# Patient Record
Sex: Male | Born: 1990 | Race: White | Hispanic: No | Marital: Single | Smoking: Former smoker
Health system: Southern US, Community
[De-identification: ages and names within clinical notes are randomized; demographics above are authoritative.]

## PROBLEM LIST (undated history)

## (undated) DIAGNOSIS — K219 Gastro-esophageal reflux disease without esophagitis: Secondary | ICD-10-CM

## (undated) HISTORY — PX: OTHER SURGICAL HISTORY: SHX169

---
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
## 2010-02-23 ENCOUNTER — Inpatient Hospital Stay (HOSPITAL_COMMUNITY): Admission: EM | Admit: 2010-02-23 | Discharge: 2010-02-25 | Payer: Self-pay | Admitting: Emergency Medicine

## 2010-08-29 LAB — COMPREHENSIVE METABOLIC PANEL WITH GFR
ALT: 23 U/L (ref 0–53)
ALT: 27 U/L (ref 0–53)
AST: 36 U/L (ref 0–37)
Albumin: 2.9 g/dL — ABNORMAL LOW (ref 3.5–5.2)
Alkaline Phosphatase: 131 U/L — ABNORMAL HIGH (ref 39–117)
CO2: 27 meq/L (ref 19–32)
Calcium: 8.1 mg/dL — ABNORMAL LOW (ref 8.4–10.5)
Chloride: 102 meq/L (ref 96–112)
Creatinine, Ser: 0.69 mg/dL (ref 0.4–1.5)
Creatinine, Ser: 0.73 mg/dL (ref 0.4–1.5)
GFR calc Af Amer: >60 mL/min (ref >60)
GFR calc Af Amer: >60 mL/min (ref >60)
GFR calc non Af Amer: >60 mL/min (ref >60)
Glucose, Bld: 141 mg/dL — ABNORMAL HIGH (ref 70–99)
Potassium: 4.2 meq/L (ref 3.5–5.1)
Sodium: 131 meq/L — ABNORMAL LOW (ref 135–145)
Sodium: 136 meq/L (ref 135–145)
Total Bilirubin: 0.7 mg/dL (ref 0.3–1.2)
Total Protein: 7 g/dL (ref 6.0–8.3)

## 2010-08-29 LAB — DIFFERENTIAL
Basophils Absolute: 0 10*3/uL (ref 0.0–0.1)
Basophils Relative: 0 % (ref 0–1)
Eosinophils Absolute: 0 10*3/uL (ref 0.0–0.7)
Eosinophils Relative: 0 % (ref 0–5)
Lymphocytes Relative: 7 % — ABNORMAL LOW (ref 12–46)
Monocytes Absolute: 1.5 10*3/uL — ABNORMAL HIGH (ref 0.1–1.0)

## 2010-08-29 LAB — CULTURE, BLOOD (ROUTINE X 2): Culture: NO GROWTH

## 2010-08-29 LAB — URINE CULTURE
Colony Count: NO GROWTH
Colony Count: NO GROWTH
Culture  Setup Time: 201109102021
Culture  Setup Time: 201109111202
Culture: NO GROWTH
Culture: NO GROWTH

## 2010-08-29 LAB — POCT I-STAT 3, ART BLOOD GAS (G3+)
Acid-Base Excess: 1 mmol/L (ref 0.0–2.0)
Bicarbonate: 25.3 mEq/L — ABNORMAL HIGH (ref 20.0–24.0)
O2 Saturation: 97 %
Patient temperature: 98.6
TCO2: 27 mmol/L (ref 0–100)
pCO2 arterial: 40.5 mmHg (ref 35.0–45.0)
pH, Arterial: 7.404 (ref 7.350–7.450)
pO2, Arterial: 91 mmHg (ref 80.0–100.0)

## 2010-08-29 LAB — D-DIMER, QUANTITATIVE: D-Dimer, Quant: 0.7 ug{FEU}/mL — ABNORMAL HIGH (ref 0.00–0.48)

## 2010-08-29 LAB — URINALYSIS, ROUTINE W REFLEX MICROSCOPIC
Bilirubin Urine: NEGATIVE
Glucose, UA: NEGATIVE mg/dL
Hgb urine dipstick: NEGATIVE
Ketones, ur: NEGATIVE mg/dL
Nitrite: NEGATIVE
Protein, ur: NEGATIVE mg/dL
Specific Gravity, Urine: 1.01 (ref 1.005–1.030)
Urobilinogen, UA: 0.2 mg/dL (ref 0.0–1.0)
pH: 6 (ref 5.0–8.0)

## 2010-08-29 LAB — COMPREHENSIVE METABOLIC PANEL
AST: 22 U/L (ref 0–37)
Albumin: 3 g/dL — ABNORMAL LOW (ref 3.5–5.2)
Alkaline Phosphatase: 138 U/L — ABNORMAL HIGH (ref 39–117)
BUN: 10 mg/dL (ref 6–23)
BUN: 7 mg/dL (ref 6–23)
CO2: 25 mEq/L (ref 19–32)
Calcium: 8.6 mg/dL (ref 8.4–10.5)
Chloride: 99 mEq/L (ref 96–112)
GFR calc non Af Amer: >60 mL/min (ref >60)
Glucose, Bld: 108 mg/dL — ABNORMAL HIGH (ref 70–99)
Potassium: 3.4 mEq/L — ABNORMAL LOW (ref 3.5–5.1)
Total Bilirubin: 0.5 mg/dL (ref 0.3–1.2)
Total Protein: 6.8 g/dL (ref 6.0–8.3)

## 2010-08-29 LAB — CBC
MCH: 29.9 pg (ref 26.0–34.0)
MCV: 88.4 fL (ref 78.0–100.0)
Platelets: 244 10*3/uL (ref 150–400)
RBC: 5.09 MIL/uL (ref 4.22–5.81)
RDW: 13 % (ref 11.5–15.5)
WBC: 14.6 10*3/uL — ABNORMAL HIGH (ref 4.0–10.5)

## 2010-08-29 LAB — BASIC METABOLIC PANEL
BUN: 8 mg/dL (ref 6–23)
CO2: 28 mEq/L (ref 19–32)
Calcium: 8.5 mg/dL (ref 8.4–10.5)
Creatinine, Ser: 0.93 mg/dL (ref 0.4–1.5)
GFR calc Af Amer: >60 mL/min (ref >60)

## 2010-08-29 LAB — CK: Total CK: 72 U/L (ref 7–232)

## 2010-08-29 LAB — MAGNESIUM: Magnesium: 1.9 mg/dL (ref 1.5–2.5)

## 2010-08-29 LAB — PHOSPHORUS: Phosphorus: 3.9 mg/dL (ref 2.3–4.6)

## 2010-08-29 LAB — BRAIN NATRIURETIC PEPTIDE: Pro B Natriuretic peptide (BNP): 40 pg/mL (ref 0.0–100.0)

## 2010-09-04 ENCOUNTER — Emergency Department (HOSPITAL_COMMUNITY)
Admission: EM | Admit: 2010-09-04 | Discharge: 2010-09-05 | Disposition: A | Discharge status: Home / Self Care | Payer: Medicaid Other | Attending: Emergency Medicine | Admitting: Emergency Medicine

## 2010-09-04 ENCOUNTER — Emergency Department (HOSPITAL_COMMUNITY): Payer: Medicaid Other

## 2010-09-04 DIAGNOSIS — F909 Attention-deficit hyperactivity disorder, unspecified type: Secondary | ICD-10-CM | POA: Insufficient documentation

## 2010-09-04 DIAGNOSIS — F319 Bipolar disorder, unspecified: Secondary | ICD-10-CM | POA: Insufficient documentation

## 2010-09-04 DIAGNOSIS — R630 Anorexia: Secondary | ICD-10-CM | POA: Insufficient documentation

## 2010-09-04 DIAGNOSIS — R110 Nausea: Secondary | ICD-10-CM | POA: Insufficient documentation

## 2010-09-04 DIAGNOSIS — R1031 Right lower quadrant pain: Secondary | ICD-10-CM | POA: Insufficient documentation

## 2010-09-04 DIAGNOSIS — Z79899 Other long term (current) drug therapy: Secondary | ICD-10-CM | POA: Insufficient documentation

## 2010-09-04 LAB — DIFFERENTIAL
Basophils Relative: 0 % (ref 0–1)
Lymphocytes Relative: 10 % — ABNORMAL LOW (ref 12–46)
Lymphs Abs: 1.5 10*3/uL (ref 0.7–4.0)
Monocytes Relative: 8 % (ref 3–12)
Neutro Abs: 12.5 10*3/uL — ABNORMAL HIGH (ref 1.7–7.7)
Neutrophils Relative %: 81 % — ABNORMAL HIGH (ref 43–77)

## 2010-09-04 LAB — URINALYSIS, ROUTINE W REFLEX MICROSCOPIC
Bilirubin Urine: NEGATIVE
Glucose, UA: NEGATIVE mg/dL
Ketones, ur: 15 mg/dL — AB
Nitrite: NEGATIVE
Specific Gravity, Urine: 1.019 (ref 1.005–1.030)
pH: 5.5 (ref 5.0–8.0)

## 2010-09-04 LAB — CBC
HCT: 45.6 % (ref 39.0–52.0)
Hemoglobin: 15.8 g/dL (ref 13.0–17.0)
MCH: 30.4 pg (ref 26.0–34.0)
MCV: 87.7 fL (ref 78.0–100.0)
RBC: 5.2 MIL/uL (ref 4.22–5.81)

## 2010-09-04 LAB — COMPREHENSIVE METABOLIC PANEL
AST: 38 U/L — ABNORMAL HIGH (ref 0–37)
CO2: 25 mEq/L (ref 19–32)
Chloride: 101 mEq/L (ref 96–112)
Creatinine, Ser: 0.84 mg/dL (ref 0.4–1.5)
GFR calc Af Amer: >60 mL/min (ref >60)
GFR calc non Af Amer: >60 mL/min (ref >60)
Glucose, Bld: 87 mg/dL (ref 70–99)
Total Bilirubin: 1.1 mg/dL (ref 0.3–1.2)

## 2010-09-05 MED ORDER — IOHEXOL 300 MG/ML  SOLN
80.0000 mL | Freq: Once | INTRAMUSCULAR | Status: AC | PRN
  Administered 2010-09-05: 80 mL via INTRAVENOUS

## 2011-05-03 ENCOUNTER — Emergency Department (HOSPITAL_COMMUNITY)
Admission: EM | Admit: 2011-05-03 | Discharge: 2011-05-04 | Disposition: A | Discharge status: Home / Self Care | Payer: Medicaid Other | Attending: Emergency Medicine | Admitting: Emergency Medicine

## 2011-05-03 ENCOUNTER — Emergency Department (HOSPITAL_COMMUNITY): Payer: Medicaid Other

## 2011-05-03 ENCOUNTER — Encounter: Payer: Self-pay | Admitting: *Deleted

## 2011-05-03 DIAGNOSIS — IMO0001 Reserved for inherently not codable concepts without codable children: Secondary | ICD-10-CM | POA: Insufficient documentation

## 2011-05-03 DIAGNOSIS — R5381 Other malaise: Secondary | ICD-10-CM | POA: Insufficient documentation

## 2011-05-03 DIAGNOSIS — R059 Cough, unspecified: Secondary | ICD-10-CM | POA: Insufficient documentation

## 2011-05-03 DIAGNOSIS — R079 Chest pain, unspecified: Secondary | ICD-10-CM | POA: Insufficient documentation

## 2011-05-03 DIAGNOSIS — Z79899 Other long term (current) drug therapy: Secondary | ICD-10-CM | POA: Insufficient documentation

## 2011-05-03 DIAGNOSIS — R05 Cough: Secondary | ICD-10-CM | POA: Insufficient documentation

## 2011-05-03 DIAGNOSIS — J3489 Other specified disorders of nose and nasal sinuses: Secondary | ICD-10-CM | POA: Insufficient documentation

## 2011-05-03 DIAGNOSIS — R000 Tachycardia, unspecified: Secondary | ICD-10-CM | POA: Insufficient documentation

## 2011-05-03 LAB — POCT I-STAT, CHEM 8
BUN: 10 mg/dL (ref 6–23)
Calcium, Ion: 1.09 mmol/L — ABNORMAL LOW (ref 1.12–1.32)
Chloride: 103 mEq/L (ref 96–112)
Creatinine, Ser: 0.8 mg/dL (ref 0.50–1.35)
Glucose, Bld: 94 mg/dL (ref 70–99)
Potassium: 4 mEq/L (ref 3.5–5.1)

## 2011-05-03 LAB — CBC
HCT: 38.8 % — ABNORMAL LOW (ref 39.0–52.0)
Hemoglobin: 13.1 g/dL (ref 13.0–17.0)
MCH: 30 pg (ref 26.0–34.0)
MCHC: 33.8 g/dL (ref 30.0–36.0)
RBC: 4.37 MIL/uL (ref 4.22–5.81)

## 2011-05-03 LAB — DIFFERENTIAL
Basophils Relative: 0 % (ref 0–1)
Lymphs Abs: 1.6 10*3/uL (ref 0.7–4.0)
Monocytes Absolute: 1.1 10*3/uL — ABNORMAL HIGH (ref 0.1–1.0)
Monocytes Relative: 7 % (ref 3–12)
Neutro Abs: 13.4 10*3/uL — ABNORMAL HIGH (ref 1.7–7.7)
Neutrophils Relative %: 82 % — ABNORMAL HIGH (ref 43–77)

## 2011-05-03 MED ORDER — ACETAMINOPHEN 500 MG PO TABS
1000.0000 mg | ORAL_TABLET | Freq: Once | ORAL | Status: AC
  Administered 2011-05-03: 975 mg via ORAL
  Filled 2011-05-03: qty 2

## 2011-05-03 MED ORDER — SODIUM CHLORIDE 0.9 % IV BOLUS (SEPSIS)
1000.0000 mL | Freq: Once | INTRAVENOUS | Status: AC
  Administered 2011-05-03: 1000 mL via INTRAVENOUS

## 2011-05-03 MED ORDER — ACETAMINOPHEN 325 MG PO TABS
ORAL_TABLET | ORAL | Status: AC
  Filled 2011-05-03: qty 3

## 2011-05-03 NOTE — ED Notes (Signed)
Patient with history of Cystic Fibrosis and has been coughing for about a  Week.  Pain now on his chest when he coughs, or touches his chest.  Patient's respiratory is intact.

## 2011-05-03 NOTE — ED Notes (Signed)
Pt c/o left sided chest pain, productive cough, with green sputum. Soreness in right shoulder blade, soreness in bilateral wrist. Denies cocaine use. Pain is constant and radiating all over per pt.

## 2011-05-03 NOTE — ED Provider Notes (Signed)
History     CSN: [id number] Arrival date & time: 05/03/2011  8:17 PM   First MD Initiated Contact with Patient 05/03/11 2128      Chief Complaint  Patient presents with  . Cough    (Consider location/radiation/quality/duration/timing/severity/associated sxs/prior treatment) Patient is a 20 y.o. male presenting with cough. The history is provided by the patient.  Cough This is a new problem. The current episode started more than 2 days ago. The problem occurs constantly. The problem has been gradually worsening. The cough is productive of purulent sputum. There has been no fever. Associated symptoms include chest pain, rhinorrhea and myalgias. Pertinent negatives include no chills, no sweats, no weight loss, no ear congestion, no ear pain, no headaches, no sore throat, no shortness of breath, no wheezing and no eye redness. He has tried nothing for the symptoms. He is not a smoker.  PMH of cystic fibrosis (followed at [facility] for this). Pt states for about the past week he has been coughing more than his baseline, and has been producing purulent sputum. He denies fever, chills, nausea, vomiting.   Past Medical History  Diagnosis Date  . Cystic fibrosis     History reviewed. No pertinent past surgical history.  No family history on file.  History  Substance Use Topics  . Smoking status: Never Smoker   . Smokeless tobacco: Not on file  . Alcohol Use: No      Review of Systems  Constitutional: Positive for fatigue. Negative for fever, chills, weight loss, diaphoresis, activity change and appetite change.  HENT: Positive for rhinorrhea. Negative for ear pain and sore throat.   Eyes: Negative for redness.  Respiratory: Positive for cough. Negative for shortness of breath and wheezing.   Cardiovascular: Positive for chest pain.  Gastrointestinal: Negative.   Musculoskeletal: Positive for myalgias.  Neurological: Negative for dizziness, syncope, light-headedness and headaches.     Allergies  Amoxapine and related; Amoxicillin; Morphine and related; and Penicillins  Home Medications   Current Outpatient Rx  Name Route Sig Dispense Refill  . AZITHROMYCIN 500 MG PO TABS Oral Take 500 mg by mouth every Monday, Wednesday, and Friday.      . BUDESONIDE 32 MCG/ACT NA SUSP Nasal Place 1 spray into the nose daily.      Marland Kitchen CALCIUM CARBONATE 1250 MG PO TABS Oral Take 1 tablet by mouth daily.      Marland Kitchen CETIRIZINE HCL 10 MG PO TABS Oral Take 10 mg by mouth at bedtime.      Marland Kitchen VITAMIN D 1000 UNITS PO TABS Oral Take 4,000 Units by mouth 2 (two) times daily.      Marland Kitchen DIPHENHYDRAMINE-APAP (SLEEP) 25-500 MG PO TABS Oral Take 2 tablets by mouth at bedtime as needed. sleep     . GUAIFENESIN 400 MG PO TABS Oral Take 400 mg by mouth every 6 (six) hours as needed. Chest congestion     . IBUPROFEN 200 MG PO TABS Oral Take 800 mg by mouth every 6 (six) hours as needed. pain     . THERA M PLUS PO TABS Oral Take 1 tablet by mouth daily.      Marland Kitchen NAPROXEN SODIUM 220 MG PO TABS Oral Take 220-440 mg by mouth 2 (two) times daily as needed. headache     . CREON PO Oral Take 8 capsules by mouth. Lipase 24,000, protease 76,000, amylase 120,000, 8 capsule with meals , 6 capsules with snacks.     Marland Kitchen PANTOPRAZOLE SODIUM 40 MG PO TBEC  Oral Take 40 mg by mouth 2 (two) times daily.      Marland Kitchen URSODIOL 300 MG PO CAPS Oral Take 300 mg by mouth 2 (two) times daily.      Marland Kitchen VITAMIN C 500 MG PO TABS Oral Take 500 mg by mouth daily.        BP 106/68  Pulse 100  Temp(Src) 99.7 F (37.6 C) (Oral)  Resp 20  SpO2 96%  Physical Exam  Nursing note and vitals reviewed. Constitutional: He is oriented to person, place, and time. He appears well-developed and well-nourished. No distress.  HENT:  Head: Normocephalic and atraumatic.  Right Ear: External ear normal.  Left Ear: External ear normal.  Mouth/Throat: Oropharynx is clear and moist. No oropharyngeal exudate.  Eyes: Conjunctivae are normal. Pupils are equal,  round, and reactive to light.  Neck: Normal range of motion. Neck supple.  Cardiovascular: Regular rhythm and normal heart sounds.  Tachycardia present.   Pulmonary/Chest: Effort normal and breath sounds normal. No respiratory distress. He has no wheezes. He has no rales. He exhibits no tenderness.  Abdominal: Soft. Bowel sounds are normal. There is no tenderness.  Musculoskeletal: Normal range of motion.  Lymphadenopathy:    He has no cervical adenopathy.  Neurological: He is alert and oriented to person, place, and time.  Skin: Skin is warm and dry. He is not diaphoretic.  Psychiatric: He has a normal mood and affect.    ED Course  Procedures (including critical care time)  Labs Reviewed  CBC - Abnormal; Notable for the following:    WBC 16.3 (*)    HCT 38.8 (*)    All other components within normal limits  DIFFERENTIAL - Abnormal; Notable for the following:    Neutrophils Relative 82 (*)    Neutro Abs 13.4 (*)    Lymphocytes Relative 10 (*)    Monocytes Absolute 1.1 (*)    All other components within normal limits  POCT I-STAT, CHEM 8 - Abnormal; Notable for the following:    Calcium, Ion 1.09 (*)    All other components within normal limits  I-STAT, CHEM 8   Dg Chest 2 View  05/03/2011  *RADIOLOGY REPORT*  Clinical Data: Cystic fibrosis, cough, fever.  CHEST - 2 VIEW  Comparison: 02/23/2010  Findings: Bronchiectatic changes involve the upper lungs, similar to prior.  No new areas of consolidation.  No pleural effusion or pneumothorax.  Cardiomediastinal contours within normal limits.  No acute osseous abnormality.  IMPRESSION: Similar chronic changes of cystic fibrosis with bronchiectasis predominately involving the upper lobes.  No new areas of consolidation.  Original Report Authenticated By: Waneta Martins, M.D.     1. Cystic fibrosis       MDM  D/W Dr. Effie Shy. Patient with a hx of CF, presenting with increased cough and myalgias, nontoxic appearing. He was  febrile on presentation, which improved with 1g of APAP. Xray appears unchanged from the pt's baseline; no signs of PNA. He was rehydrated in the dept with about of fluids and VS responded well to this; they were stable at d/c. He was d/ced home with rx for Avelox, which he is to take on top of his 3x weekly Zithromax. He was instructed to f/u here, with PCP, or with specialist at [facility] if he is not improving or is worsening. He verbalized understanding and agreed to plan.        Grant Fontana, [place] 05/05/11 1150

## 2011-05-04 MED ORDER — MOXIFLOXACIN HCL 400 MG PO TABS: 400.0000 mg | ORAL_TABLET | Freq: Every day | ORAL | Status: DC

## 2011-05-04 NOTE — ED Notes (Signed)
Call received from [facility] requesting change in Avelox due to being too expensive. RX changed by Cherrie Distance PA to Z-pack. New RX called to [facility] [phone]

## 2011-05-04 NOTE — ED Notes (Signed)
Patient transported to X-ray 

## 2011-05-05 NOTE — ED Provider Notes (Signed)
Medical screening examination/treatment/procedure(s) were performed by non-physician practitioner and as supervising physician I was immediately available for consultation/collaboration.  Wanell Lorenzi L Kailan Laws, MD 05/05/11 1319 

## 2011-05-13 ENCOUNTER — Encounter (HOSPITAL_COMMUNITY): Payer: Self-pay | Admitting: Emergency Medicine

## 2011-05-13 ENCOUNTER — Emergency Department (HOSPITAL_COMMUNITY): Payer: Medicaid Other

## 2011-05-13 ENCOUNTER — Inpatient Hospital Stay (HOSPITAL_COMMUNITY)
Admission: EM | Admit: 2011-05-13 | Discharge: 2011-05-16 | DRG: 202 | Disposition: A | Discharge status: Home / Self Care | Payer: Medicaid Other | Attending: Internal Medicine | Admitting: Internal Medicine

## 2011-05-13 DIAGNOSIS — R110 Nausea: Secondary | ICD-10-CM

## 2011-05-13 DIAGNOSIS — J40 Bronchitis, not specified as acute or chronic: Secondary | ICD-10-CM | POA: Diagnosis present

## 2011-05-13 DIAGNOSIS — D72829 Elevated white blood cell count, unspecified: Secondary | ICD-10-CM | POA: Diagnosis present

## 2011-05-13 DIAGNOSIS — R062 Wheezing: Secondary | ICD-10-CM

## 2011-05-13 DIAGNOSIS — R079 Chest pain, unspecified: Secondary | ICD-10-CM | POA: Diagnosis present

## 2011-05-13 DIAGNOSIS — Z87891 Personal history of nicotine dependence: Secondary | ICD-10-CM

## 2011-05-13 DIAGNOSIS — R0600 Dyspnea, unspecified: Secondary | ICD-10-CM

## 2011-05-13 DIAGNOSIS — J209 Acute bronchitis, unspecified: Principal | ICD-10-CM | POA: Diagnosis present

## 2011-05-13 DIAGNOSIS — F909 Attention-deficit hyperactivity disorder, unspecified type: Secondary | ICD-10-CM | POA: Diagnosis present

## 2011-05-13 DIAGNOSIS — R509 Fever, unspecified: Secondary | ICD-10-CM

## 2011-05-13 DIAGNOSIS — K8689 Other specified diseases of pancreas: Secondary | ICD-10-CM | POA: Diagnosis present

## 2011-05-13 DIAGNOSIS — F319 Bipolar disorder, unspecified: Secondary | ICD-10-CM | POA: Diagnosis present

## 2011-05-13 HISTORY — DX: Gastro-esophageal reflux disease without esophagitis: K21.9

## 2011-05-13 LAB — BASIC METABOLIC PANEL
BUN: 8 mg/dL (ref 6–23)
Calcium: 9.4 mg/dL (ref 8.4–10.5)
Creatinine, Ser: 0.89 mg/dL (ref 0.50–1.35)
GFR calc Af Amer: >90 mL/min (ref >90)
GFR calc non Af Amer: >90 mL/min (ref >90)
Glucose, Bld: 101 mg/dL — ABNORMAL HIGH (ref 70–99)
Potassium: 3.7 mEq/L (ref 3.5–5.1)

## 2011-05-13 LAB — CBC
HCT: 45.3 % (ref 39.0–52.0)
Hemoglobin: 15.3 g/dL (ref 13.0–17.0)
MCH: 30 pg (ref 26.0–34.0)
MCHC: 33.8 g/dL (ref 30.0–36.0)
MCV: 88.8 fL (ref 78.0–100.0)
RDW: 12.8 % (ref 11.5–15.5)

## 2011-05-13 MED ORDER — SODIUM CHLORIDE 0.9 % IV SOLN
INTRAVENOUS | Status: DC
  Administered 2011-05-13: 08:00:00 via INTRAVENOUS

## 2011-05-13 MED ORDER — PNEUMOCOCCAL VAC POLYVALENT 25 MCG/0.5ML IJ INJ
0.5000 mL | INJECTION | INTRAMUSCULAR | Status: AC
  Administered 2011-05-14: 0.5 mL via INTRAMUSCULAR
  Filled 2011-05-13: qty 0.5

## 2011-05-13 MED ORDER — INFLUENZA VIRUS VACC SPLIT PF IM SUSP
0.5000 mL | INTRAMUSCULAR | Status: AC
  Administered 2011-05-14: 0.5 mL via INTRAMUSCULAR
  Filled 2011-05-13: qty 0.5

## 2011-05-13 MED ORDER — ALBUTEROL SULFATE (5 MG/ML) 0.5% IN NEBU
5.0000 mg | INHALATION_SOLUTION | RESPIRATORY_TRACT | Status: DC | PRN
  Administered 2011-05-13: 5 mg via RESPIRATORY_TRACT

## 2011-05-13 MED ORDER — ENOXAPARIN SODIUM 40 MG/0.4ML ~~LOC~~ SOLN
40.0000 mg | SUBCUTANEOUS | Status: DC
  Administered 2011-05-14: 40 mg via SUBCUTANEOUS
  Filled 2011-05-13 (×5): qty 0.4

## 2011-05-13 MED ORDER — ALBUTEROL SULFATE (5 MG/ML) 0.5% IN NEBU
5.0000 mg | INHALATION_SOLUTION | Freq: Once | RESPIRATORY_TRACT | Status: DC
  Filled 2011-05-13: qty 1

## 2011-05-13 MED ORDER — ONDANSETRON HCL 4 MG PO TABS: 4.0000 mg | ORAL_TABLET | Freq: Four times a day (QID) | ORAL | Status: DC | PRN

## 2011-05-13 MED ORDER — MOXIFLOXACIN HCL IN NACL 400 MG/250ML IV SOLN
400.0000 mg | Freq: Once | INTRAVENOUS | Status: AC
  Administered 2011-05-13: 400 mg via INTRAVENOUS
  Filled 2011-05-13: qty 250

## 2011-05-13 MED ORDER — ALBUTEROL SULFATE (5 MG/ML) 0.5% IN NEBU
5.0000 mg | INHALATION_SOLUTION | Freq: Once | RESPIRATORY_TRACT | Status: AC
  Administered 2011-05-13: 5 mg via RESPIRATORY_TRACT
  Filled 2011-05-13: qty 1

## 2011-05-13 MED ORDER — ALBUTEROL SULFATE (5 MG/ML) 0.5% IN NEBU
5.0000 mg | INHALATION_SOLUTION | Freq: Four times a day (QID) | RESPIRATORY_TRACT | Status: AC
  Administered 2011-05-13 – 2011-05-14 (×2): 5 mg via RESPIRATORY_TRACT
  Filled 2011-05-13: qty 1
  Filled 2011-05-13: qty 0.5

## 2011-05-13 MED ORDER — IPRATROPIUM BROMIDE 0.02 % IN SOLN
0.5000 mg | Freq: Four times a day (QID) | RESPIRATORY_TRACT | Status: DC
  Administered 2011-05-13 – 2011-05-16 (×8): 0.5 mg via RESPIRATORY_TRACT
  Filled 2011-05-13 (×9): qty 2.5

## 2011-05-13 MED ORDER — SODIUM CHLORIDE 0.9 % IJ SOLN: 3.0000 mL | INTRAMUSCULAR | Status: DC | PRN

## 2011-05-13 MED ORDER — HYDROCODONE-ACETAMINOPHEN 5-325 MG PO TABS
2.0000 | ORAL_TABLET | Freq: Once | ORAL | Status: AC
  Administered 2011-05-13: 2 via ORAL
  Filled 2011-05-13: qty 2

## 2011-05-13 MED ORDER — IPRATROPIUM BROMIDE 0.02 % IN SOLN
0.5000 mg | Freq: Once | RESPIRATORY_TRACT | Status: DC
  Filled 2011-05-13: qty 2.5

## 2011-05-13 MED ORDER — ALUM & MAG HYDROXIDE-SIMETH 200-200-20 MG/5ML PO SUSP: 30.0000 mL | Freq: Four times a day (QID) | ORAL | Status: DC | PRN

## 2011-05-13 MED ORDER — IPRATROPIUM BROMIDE 0.02 % IN SOLN
0.5000 mg | Freq: Once | RESPIRATORY_TRACT | Status: AC
  Administered 2011-05-13: 0.5 mg via RESPIRATORY_TRACT

## 2011-05-13 MED ORDER — ACETAMINOPHEN 325 MG PO TABS: 650.0000 mg | ORAL_TABLET | ORAL | Status: DC | PRN

## 2011-05-13 MED ORDER — SODIUM CHLORIDE 0.9 % IJ SOLN
3.0000 mL | Freq: Two times a day (BID) | INTRAMUSCULAR | Status: DC
  Administered 2011-05-13: 3 mL via INTRAVENOUS
  Administered 2011-05-14: 23:00:00 via INTRAVENOUS
  Administered 2011-05-14 – 2011-05-16 (×4): 3 mL via INTRAVENOUS

## 2011-05-13 MED ORDER — ONDANSETRON HCL 4 MG/2ML IJ SOLN: 4.0000 mg | Freq: Four times a day (QID) | INTRAMUSCULAR | Status: DC | PRN

## 2011-05-13 MED ORDER — OXYCODONE HCL 5 MG PO TABS
5.0000 mg | ORAL_TABLET | ORAL | Status: DC | PRN
  Administered 2011-05-13 – 2011-05-16 (×3): 5 mg via ORAL
  Filled 2011-05-13 (×3): qty 1

## 2011-05-13 MED ORDER — SODIUM CHLORIDE 0.9 % IV BOLUS (SEPSIS)
500.0000 mL | Freq: Once | INTRAVENOUS | Status: AC
  Administered 2011-05-13: 500 mL via INTRAVENOUS

## 2011-05-13 MED ORDER — SENNA 8.6 MG PO TABS: 2.0000 | ORAL_TABLET | Freq: Every day | ORAL | Status: DC | PRN

## 2011-05-13 MED ORDER — ONDANSETRON HCL 4 MG/2ML IJ SOLN
4.0000 mg | Freq: Once | INTRAMUSCULAR | Status: AC
  Administered 2011-05-13: 4 mg via INTRAVENOUS
  Filled 2011-05-13: qty 2

## 2011-05-13 NOTE — H&P (Signed)
PCP:   Leo Grosser, MD, MD   Pulmonologist- Candise Bowens Mcguire [phone] [facility])  Chief Complaint:  Shortness of breath and chest pain.   HPI: Male with cystic fibrosis who comes in with complaint of increasing shortness of breath and pain in the center of his chest. The patient was at [facility] and week ago with similar complaints. He was discharged on Avelox but later switched to ciprofloxacin by his pulmonologist. He states that he has been taking the ciprofloxacin for one week now but his symptoms have worsened. He's received nebulizer treatments and Vicodin in the ER and his dyspnea and pain have both improved. He is able to talk to me in complete sentences. He states his pain is now 5/10 and more bearable. He states that he is coughing up greenish colored sputum and has felt feverish. He is being admitted for pulmonary exacerbation of cystic fibrosis.   I have spoken with his pulmonologist who states that his last sputum culture about 3 months ago grew out Pseudomonas. This was during his last admission in [place]. She states that he did well with tobramycin and ceftaz and recommends that we give him both of these for a total of 2 weeks.   Review of Systems:  Positive for fatigue and loss of about 10-15 pounds in weight over the past 3 months. He has a poor appetite now. He has nausea and had one episode of vomiting today. He has felt feverish as well. HEENT: No sore throat sinus trouble or earache. No blurred vision or double vision. Respiratory: As mentioned in history of present illness. Cardiac: Pain in the center of his chest which is associated more with taking a deep breath and coughing. No palpitations and no pedal edema. GI: positive for vomiting today; no diarrhea or abdominal pain. GU: No dysuria or hematuria Skin: No rash or easy bruising Neurologic: No seizures Psychologic: No depression or anxiety  Past Medical History: Past Medical History    Diagnosis Date  . Cystic fibrosis   . Acid reflux    Past Surgical History  Procedure Date  . Right ear surgery     removal of excess skin  . Picc line placements     Medications: Prior to Admission medications   Medication Sig Start Date End Date Taking? Authorizing Provider  aspirin 325 MG tablet Take 325 mg by mouth every 6 (six) hours as needed. pain    Yes Historical Provider, MD  azithromycin (ZITHROMAX) 500 MG tablet Take 500 mg by mouth every Monday, Wednesday, and Friday.     Yes Historical Provider, MD  Aztreonam Lysine (CAYSTON) 75 MG SOLR Inhale 75 mg into the lungs 3 (three) times daily. Pt does every other month    Yes Historical Provider, MD  budesonide (RHINOCORT AQUA) 32 MCG/ACT nasal spray Place 1 spray into the nose daily.     Yes Historical Provider, MD  cetirizine (ZYRTEC) 10 MG tablet Take 10 mg by mouth at bedtime.     Yes Historical Provider, MD  cholecalciferol (VITAMIN D) 1000 UNITS tablet Take 4,000 Units by mouth 2 (two) times daily.     Yes Historical Provider, MD  guaifenesin (HUMIBID E) 400 MG TABS Take 400 mg by mouth every 6 (six) hours as needed. Chest congestion    Yes Historical Provider, MD  Multiple Vitamins-Minerals (MULTIVITAMINS THER. W/MINERALS) TABS Take 1 tablet by mouth daily.     Yes Historical Provider, MD  naproxen sodium (ANAPROX) 220 MG tablet Take  220-440 mg by mouth 2 (two) times daily as needed. headache    Yes Historical Provider, MD  Pancrelipase, Lip-Prot-Amyl, (CREON PO) Take 8 capsules by mouth. Lipase 24,000, protease 76,000, amylase 120,000, 8 capsule with meals , 6 capsules with snacks.    Yes Historical Provider, MD  pantoprazole (PROTONIX) 40 MG tablet Take 40 mg by mouth 2 (two) times daily.     Yes Historical Provider, MD  ursodiol (ACTIGALL) 300 MG capsule Take 300 mg by mouth 2 (two) times daily.     Yes Historical Provider, MD  vitamin C (ASCORBIC ACID) 500 MG tablet Take 500 mg by mouth daily.     Yes Historical  Provider, MD  albuterol (PROVENTIL HFA;VENTOLIN HFA) 108 (90 BASE) MCG/ACT inhaler Inhale 2 puffs into the lungs every 6 (six) hours as needed. Wheezing      Historical Provider, MD  diphenhydramine-acetaminophen (TYLENOL PM EXTRA STRENGTH) 25-500 MG TABS Take 2 tablets by mouth at bedtime as needed. sleep     Historical Provider, MD  ibuprofen (ADVIL,MOTRIN) 200 MG tablet Take 800 mg by mouth every 6 (six) hours as needed. pain     Historical Provider, MD    Allergies:   Allergies  Allergen Reactions  . Keflex- hives.        . Morphine And Related Other (See Comments)    Skin burns   . Penicillins Hives and Other (See Comments)        Social History:  reports that he quit smoking about 4 weeks ago. His smoking use included Cigarettes. He has never used smokeless tobacco. He reports that he does not drink alcohol or use illicit drugs. he is currently living with his grandparents.  Family History: History reviewed. No pertinent family history. issue heart disease and diabetes in the family. His grandfather had esophago-cancer.  Physical Exam: Filed Vitals:   05/13/11 0756 05/13/11 0949 05/13/11 1138 05/13/11 1513  BP:  92/50 108/60 88/40  Pulse:  73 72 52  Temp:  98.2 F (36.8 C)  97.7 F (36.5 C)  TempSrc:  Oral  Oral  Resp:  24  12  Height:      Weight:      SpO2: 100% 98% 97% 95%   General appearance: alert, cooperative and no distress Throat: lips, mucosa, and tongue normal; teeth and gums normal Resp: clear to auscultation bilaterally Cardio: regular rate and rhythm, S1, S2 normal, no murmur, click, rub or gallop GI: soft, non-tender; bowel sounds normal; no masses,  no organomegaly Extremities: extremities normal, atraumatic, no cyanosis or edema Skin: Skin color, texture, turgor normal. No rashes or lesions Neurologic: Grossly normal   Labs on Admission:   [facility] 05/13/11 0810  NA 135  K 3.7  CL 99  CO2 25  GLUCOSE 101*  BUN 8  CREATININE 0.89   CALCIUM 9.4  MG --  PHOS --   No results found for this basename: AST:2,ALT:2,ALKPHOS:2,BILITOT:2,PROT:2,ALBUMIN:2 in the last 72 hours No results found for this basename: LIPASE:2,AMYLASE:2 in the last 72 hours  Basename 05/13/11 0810  WBC 16.0*  NEUTROABS --  HGB 15.3  HCT 45.3  MCV 88.8  PLT 306   No results found for this basename: CKTOTAL:3,CKMB:3,CKMBINDEX:3,TROPONINI:3 in the last 72 hours No results found for this basename: TSH,T4TOTAL,FREET3,T3FREE,THYROIDAB in the last 72 hours No results found for this basename: VITAMINB12:2,FOLATE:2,FERRITIN:2,TIBC:2,IRON:2,RETICCTPCT:2 in the last 72 hours  Radiological Exams on Admission: Dg Chest 2 View  05/13/2011  *RADIOLOGY REPORT*  Clinical Data: History of cystic fibrosis,  shortness of breath and nausea.  CHEST - 2 VIEW  Comparison: 05/03/2011.  Findings: Reticular interstitial pattern within the lungs and bronchiectatic changes appear stable.  No definite acute superimposed process is identified.  No consolidation or pleural effusion is evident. The cardiac silhouette is normal size and shape.  Mediastinal and hilar contours appear stable. Bones appear average for age.  IMPRESSION: Reticular interstitial pattern within the lungs and bronchiectatic changes appear stable.  No definite acute superimposed process is identified.  Original Report Authenticated By: Crawford Givens, M.D.   Dg Chest 2 View  05/03/2011  *RADIOLOGY REPORT*  Clinical Data: Cystic fibrosis, cough, fever.  CHEST - 2 VIEW  Comparison: 02/23/2010  Findings: Bronchiectatic changes involve the upper lungs, similar to prior.  No new areas of consolidation.  No pleural effusion or pneumothorax.  Cardiomediastinal contours within normal limits.  No acute osseous abnormality.  IMPRESSION: Similar chronic changes of cystic fibrosis with bronchiectasis predominately involving the upper lobes.  No new areas of consolidation.  Original Report Authenticated By: Waneta Martins,  M.D.     Assessment/Plan Principal Problem:  *Cystic fibrosis- pulmonary exacerbation/ leuokocytosis Tobramycin and Ceftazidime. 2 wks recommended by his pulmonologist.  Hypertonic saline nebs. Atrovent and Albuterol nebs.  Sputum culture.  Check for influenza.   Chest pain- Pleuritic. Vicodin PRN.  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    [name] [name] 05/13/2011, 6:15 PM

## 2011-05-13 NOTE — ED Notes (Signed)
Pt states has been in pain x 2 days, upon waking last pm he experienced more pain and sob as well as n/v. Pt talking in complete sentences states cannot take a deep breath, states painful when coughing.

## 2011-05-13 NOTE — ED Notes (Signed)
Bed:WA01<BR> Expected date:<BR> Expected time:<BR> Means of arrival:<BR> Comments:<BR> closed

## 2011-05-13 NOTE — ED Notes (Signed)
Pts grandmother called and stated pts PCP at [facility] would like to speak with admission MD in regards to pts cystic fibrosis and medications. Dr. Athena Masse [phone].

## 2011-05-13 NOTE — ED Notes (Signed)
RT called for breathing Tx.

## 2011-05-13 NOTE — ED Notes (Signed)
Patient transported to X-ray 

## 2011-05-13 NOTE — ED Provider Notes (Signed)
History     CSN: [id number] Arrival date & time: 05/13/2011  7:15 AM   First MD Initiated Contact with Patient 05/13/11 [phone]      Chief Complaint  Patient presents with  . Shortness of Breath    (Consider location/radiation/quality/duration/timing/severity/associated sxs/prior treatment) Patient is a 20 y.o. male presenting with shortness of breath. The history is provided by the patient.  Shortness of Breath  Associated symptoms include cough and shortness of breath. Pertinent negatives include no fever.  pt with hx cystic fibrosis, states in past couple weeks had noted increased cough, generally non productive. Subjective fever. No chills/sweats. States at baseline takes zithromax 3x/week, was placed on cipro 1 week ago. w coughing spells notes mid chest, sharp pain, worse w coughing/movement/palpation chest wall. No leg pain or swelling. No dvt or pe hx. No exertional cp or discomfort. No diaphoresis. Nausea this morning.   Past Medical History  Diagnosis Date  . Cystic fibrosis     History reviewed. No pertinent past surgical history.  History reviewed. No pertinent family history.  History  Substance Use Topics  . Smoking status: Never Smoker   . Smokeless tobacco: Not on file  . Alcohol Use: No      Review of Systems  Constitutional: Negative for fever.  HENT: Negative for neck pain.   Eyes: Negative for redness.  Respiratory: Positive for cough and shortness of breath.   Cardiovascular: Negative for leg swelling.  Gastrointestinal: Negative for abdominal pain.  Genitourinary: Negative for flank pain.  Musculoskeletal: Negative for back pain.  Skin: Negative for rash.  Neurological: Negative for headaches.  Hematological: Does not bruise/bleed easily.  Psychiatric/Behavioral: Negative for confusion.    Allergies  Amoxapine and related; Amoxicillin; Morphine and related; and Penicillins  Home Medications   Current Outpatient Rx  Name Route Sig Dispense  Refill  . AZITHROMYCIN 500 MG PO TABS Oral Take 500 mg by mouth every Monday, Wednesday, and Friday.      . BUDESONIDE 32 MCG/ACT NA SUSP Nasal Place 1 spray into the nose daily.      Marland Kitchen CALCIUM CARBONATE 1250 MG PO TABS Oral Take 1 tablet by mouth daily.      Marland Kitchen CETIRIZINE HCL 10 MG PO TABS Oral Take 10 mg by mouth at bedtime.      Marland Kitchen VITAMIN D 1000 UNITS PO TABS Oral Take 4,000 Units by mouth 2 (two) times daily.      Marland Kitchen DIPHENHYDRAMINE-APAP (SLEEP) 25-500 MG PO TABS Oral Take 2 tablets by mouth at bedtime as needed. sleep     . GUAIFENESIN 400 MG PO TABS Oral Take 400 mg by mouth every 6 (six) hours as needed. Chest congestion     . IBUPROFEN 200 MG PO TABS Oral Take 800 mg by mouth every 6 (six) hours as needed. pain     . MOXIFLOXACIN HCL 400 MG PO TABS Oral Take 1 tablet (400 mg total) by mouth daily. 10 tablet 0  . THERA M PLUS PO TABS Oral Take 1 tablet by mouth daily.      Marland Kitchen NAPROXEN SODIUM 220 MG PO TABS Oral Take 220-440 mg by mouth 2 (two) times daily as needed. headache     . CREON PO Oral Take 8 capsules by mouth. Lipase 24,000, protease 76,000, amylase 120,000, 8 capsule with meals , 6 capsules with snacks.     Marland Kitchen PANTOPRAZOLE SODIUM 40 MG PO TBEC Oral Take 40 mg by mouth 2 (two) times daily.      Marland Kitchen  URSODIOL 300 MG PO CAPS Oral Take 300 mg by mouth 2 (two) times daily.      Marland Kitchen VITAMIN C 500 MG PO TABS Oral Take 500 mg by mouth daily.        BP 109/92  Pulse 95  Temp(Src) 100.2 F (37.9 C) (Oral)  Resp 16  Ht 5\' 9"  (1.753 m)  Wt 122 lb (55.339 kg)  BMI 18.02 kg/m2  SpO2 100%  Physical Exam  Nursing note and vitals reviewed. Constitutional: He is oriented to person, place, and time. He appears well-developed and well-nourished. No distress.  HENT:  Head: Atraumatic.  Mouth/Throat: Oropharynx is clear and moist.  Eyes: Pupils are equal, round, and reactive to light.  Neck: Neck supple. No tracheal deviation present.  Cardiovascular: Normal rate, regular rhythm and normal  heart sounds.  Exam reveals no gallop and no friction rub.   No murmur heard. Pulmonary/Chest: Effort normal. No accessory muscle usage. No respiratory distress. He exhibits tenderness.       Upper resp congestion, rhonchi  Abdominal: Soft. He exhibits no distension. There is no tenderness.  Musculoskeletal: Normal range of motion. He exhibits no edema and no tenderness.  Neurological: He is alert and oriented to person, place, and time.  Skin: Skin is warm and dry.  Psychiatric: He has a normal mood and affect.    ED Course  Procedures (including critical care time)   Labs Reviewed  CBC  BASIC METABOLIC PANEL   Results for orders placed during the hospital encounter of 05/13/11  CBC      Component Value Range   WBC 16.0 (*) 4.0 - 10.5 (K/uL)   RBC 5.10  4.22 - 5.81 (MIL/uL)   Hemoglobin 15.3  13.0 - 17.0 (g/dL)   HCT 60.4  54.0 - 98.1 (%)   MCV 88.8  78.0 - 100.0 (fL)   MCH 30.0  26.0 - 34.0 (pg)   MCHC 33.8  30.0 - 36.0 (g/dL)   RDW 19.1  47.8 - 29.5 (%)   Platelets 306  150 - 400 (K/uL)  BASIC METABOLIC PANEL      Component Value Range   Sodium 135  135 - 145 (mEq/L)   Potassium 3.7  3.5 - 5.1 (mEq/L)   Chloride 99  96 - 112 (mEq/L)   CO2 25  19 - 32 (mEq/L)   Glucose, Bld 101 (*) 70 - 99 (mg/dL)   BUN 8  6 - 23 (mg/dL)   Creatinine, Ser 6.21  0.50 - 1.35 (mg/dL)   Calcium 9.4  8.4 - 30.8 (mg/dL)   GFR calc non Af Amer >90  >90 (mL/min)   GFR calc Af Amer >90  >90 (mL/min)   Dg Chest 2 View  05/13/2011  *RADIOLOGY REPORT*  Clinical Data: History of cystic fibrosis, shortness of breath and nausea.  CHEST - 2 VIEW  Comparison: 05/03/2011.  Findings: Reticular interstitial pattern within the lungs and bronchiectatic changes appear stable.  No definite acute superimposed process is identified.  No consolidation or pleural effusion is evident. The cardiac silhouette is normal size and shape.  Mediastinal and hilar contours appear stable. Bones appear average for age.   IMPRESSION: Reticular interstitial pattern within the lungs and bronchiectatic changes appear stable.  No definite acute superimposed process is identified.  Original Report Authenticated By: Crawford Givens, M.D.   Dg Chest 2 View  05/03/2011  *RADIOLOGY REPORT*  Clinical Data: Cystic fibrosis, cough, fever.  CHEST - 2 VIEW  Comparison: 02/23/2010  Findings: Bronchiectatic  changes involve the upper lungs, similar to prior.  No new areas of consolidation.  No pleural effusion or pneumothorax.  Cardiomediastinal contours within normal limits.  No acute osseous abnormality.  IMPRESSION: Similar chronic changes of cystic fibrosis with bronchiectasis predominately involving the upper lobes.  No new areas of consolidation.  Original Report Authenticated By: Waneta Martins, M.D.       MDM  Xrays. Labs. Albuterol and atrovent neb.    Allergies noted, pt states can take vicodin or percocet for pain.   vicodin po. Recheck no increased wob.    avelox iv.    Given wheezing/sob, subj worsening/fevers after 1 week rx cipro, triad called to admit. Says to admit to team 7.  Suzi Roots, MD 05/13/11 1054

## 2011-05-14 DIAGNOSIS — R079 Chest pain, unspecified: Secondary | ICD-10-CM

## 2011-05-14 DIAGNOSIS — J189 Pneumonia, unspecified organism: Secondary | ICD-10-CM

## 2011-05-14 LAB — CBC
HCT: 35.9 % — ABNORMAL LOW (ref 39.0–52.0)
Platelets: 231 10*3/uL (ref 150–400)
RDW: 12.9 % (ref 11.5–15.5)
WBC: 9.8 10*3/uL (ref 4.0–10.5)

## 2011-05-14 LAB — INFLUENZA PANEL BY PCR (TYPE A & B)
Influenza A By PCR: NEGATIVE
Influenza B By PCR: NEGATIVE

## 2011-05-14 MED ORDER — AZTREONAM LYSINE 75 MG IN SOLR
75.0000 mg | Freq: Three times a day (TID) | RESPIRATORY_TRACT | Status: DC
  Filled 2011-05-14 (×6): qty 75

## 2011-05-14 MED ORDER — LORATADINE 10 MG PO TABS
10.0000 mg | ORAL_TABLET | Freq: Every day | ORAL | Status: DC
  Administered 2011-05-14 – 2011-05-16 (×3): 10 mg via ORAL
  Filled 2011-05-14 (×3): qty 1

## 2011-05-14 MED ORDER — SODIUM CHLORIDE 3 % IN NEBU
15.0000 mL | INHALATION_SOLUTION | Freq: Four times a day (QID) | RESPIRATORY_TRACT | Status: DC
  Administered 2011-05-14 – 2011-05-15 (×4): 15 mL via RESPIRATORY_TRACT
  Filled 2011-05-14 (×12): qty 15

## 2011-05-14 MED ORDER — AZITHROMYCIN 500 MG PO TABS: 500.0000 mg | ORAL_TABLET | ORAL | Status: DC

## 2011-05-14 MED ORDER — TOBRAMYCIN SULFATE 80 MG/2ML IJ SOLN
280.0000 mg | Freq: Two times a day (BID) | INTRAVENOUS | Status: DC
  Administered 2011-05-14 – 2011-05-16 (×4): 280 mg via INTRAVENOUS
  Filled 2011-05-14 (×9): qty 7

## 2011-05-14 MED ORDER — URSODIOL 300 MG PO CAPS
300.0000 mg | ORAL_CAPSULE | Freq: Two times a day (BID) | ORAL | Status: DC
  Administered 2011-05-14 – 2011-05-16 (×5): 300 mg via ORAL
  Filled 2011-05-14 (×7): qty 1

## 2011-05-14 MED ORDER — TOBRAMYCIN SULFATE 80 MG/2ML IJ SOLN
140.0000 mg | Freq: Four times a day (QID) | INTRAVENOUS | Status: DC
  Administered 2011-05-14: 140 mg via INTRAVENOUS
  Filled 2011-05-14 (×4): qty 3.5

## 2011-05-14 MED ORDER — PANCRELIPASE (LIP-PROT-AMYL) 12000-38000 UNITS PO CPEP
2.0000 | ORAL_CAPSULE | Freq: Three times a day (TID) | ORAL | Status: DC
  Administered 2011-05-14 – 2011-05-16 (×7): 2 via ORAL
  Filled 2011-05-14 (×8): qty 2

## 2011-05-14 MED ORDER — HYDROCODONE-ACETAMINOPHEN 5-325 MG PO TABS: 2.0000 | ORAL_TABLET | ORAL | Status: DC | PRN

## 2011-05-14 MED ORDER — PANTOPRAZOLE SODIUM 40 MG PO TBEC
40.0000 mg | DELAYED_RELEASE_TABLET | Freq: Two times a day (BID) | ORAL | Status: DC
  Administered 2011-05-14 – 2011-05-16 (×4): 40 mg via ORAL
  Filled 2011-05-14 (×4): qty 1

## 2011-05-14 MED ORDER — GUAIFENESIN 400 MG PO TABS
400.0000 mg | ORAL_TABLET | Freq: Four times a day (QID) | ORAL | Status: DC
  Administered 2011-05-14 – 2011-05-16 (×9): 400 mg via ORAL
  Filled 2011-05-14 (×12): qty 1

## 2011-05-14 MED ORDER — VITAMIN C 500 MG PO TABS
500.0000 mg | ORAL_TABLET | Freq: Every day | ORAL | Status: DC
  Administered 2011-05-14 – 2011-05-16 (×3): 500 mg via ORAL
  Filled 2011-05-14 (×3): qty 1

## 2011-05-14 MED ORDER — CEFTAZIDIME 2 G IJ SOLR
2.0000 g | Freq: Three times a day (TID) | INTRAMUSCULAR | Status: DC
Start: 2011-05-14 — End: 2011-05-16
  Administered 2011-05-14 – 2011-05-16 (×7): 2 g via INTRAVENOUS
  Filled 2011-05-14 (×9): qty 2

## 2011-05-14 MED ORDER — THERA M PLUS PO TABS
1.0000 | ORAL_TABLET | Freq: Every day | ORAL | Status: DC
Start: 2011-05-14 — End: 2011-05-16
  Administered 2011-05-14: 13:00:00 via ORAL
  Administered 2011-05-15 – 2011-05-16 (×2): 1 via ORAL
  Filled 2011-05-14 (×3): qty 1

## 2011-05-14 MED ORDER — FLUTICASONE PROPIONATE 50 MCG/ACT NA SUSP
2.0000 | Freq: Every day | NASAL | Status: DC
  Administered 2011-05-14 – 2011-05-16 (×3): 2 via NASAL
  Filled 2011-05-14: qty 16

## 2011-05-14 MED ORDER — CEFTAZIDIME 2 G IJ SOLR: 2.0000 g | Freq: Three times a day (TID) | INTRAMUSCULAR | Status: DC

## 2011-05-14 MED ORDER — VITAMIN D3 25 MCG (1000 UNIT) PO TABS
4000.0000 [IU] | ORAL_TABLET | Freq: Two times a day (BID) | ORAL | Status: DC
  Administered 2011-05-14 – 2011-05-16 (×5): 4000 [IU] via ORAL
  Filled 2011-05-14 (×6): qty 4

## 2011-05-14 MED ORDER — ACETAMINOPHEN 325 MG PO TABS: 650.0000 mg | ORAL_TABLET | Freq: Four times a day (QID) | ORAL | Status: DC | PRN

## 2011-05-14 NOTE — Progress Notes (Signed)
CARE MANAGEMENT NOTE 05/14/2011  Patient:  [name] [name]   Account Number:  [id number]  Date Initiated:  05/14/2011  Documentation initiated by:  [name] [name]  Subjective/Objective Assessment:   pt with hx of cystic fibrosis with excerbation of increased sputum and fevers     Action/Plan:   lives at home   Anticipated DC Date:  05/17/2011   Anticipated DC Plan:  HOME/SELF CARE  In-house referral  NA      DC Planning Services  NA      [facility] Choice  NA   Choice offered to / List presented to:  NA   DME arranged  NA      DME agency  NA     HH arranged  NA      HH agency  NA   Status of service:  In process, will continue to follow Medicare Important Message given?   (If response is "NO", the following Medicare IM given date fields will be blank) Date Medicare IM given:   Date Additional Medicare IM given:    Discharge Disposition:    Per UR Regulation:  Reviewed for med. necessity/level of care/duration of stay  Comments:  [id number]/[name] Earlene Plater, RN, BSN, CCM/CHART REVIEW FOR UR PERFORMED.

## 2011-05-14 NOTE — Progress Notes (Signed)
Subjective: I have reviewed the patient's admission history physical and data base in detail. This morning he states that he feels "a little better". He states that he is producing a significant amount of mucus above his baseline. He still feels that his breathing is not "back to normal yet". He reports that his chest pain is much improved. He denies nausea or vomiting or severe abdominal pain.  Objective: Weight change:   Intake/Output Summary (Last 24 hours) at 05/14/11 0937 Last data filed at 05/14/11 0537  Gross per 24 hour  Intake      0 ml  Output      1 ml  Net     -1 ml   Blood pressure 93/51, pulse 82, temperature 98.9 F (37.2 C), temperature source Oral, resp. rate 16, height 5\' 9"  (1.753 m), weight 57.3 kg (126 lb 5.2 oz), SpO2 99.00%. Temp:  [97.7 F (36.5 C)-98.9 F (37.2 C)] 98.9 F (37.2 C) (11/28 0533) Pulse Rate:  [52-82] 82  (11/28 0533) Resp:  [12-24] 16  (11/28 0533) BP: (88-110)/(40-60) 93/51 mmHg (11/28 0533) SpO2:  [95 %-99 %] 99 % (11/28 0817) Weight:  [57.3 kg (126 lb 5.2 oz)] 126 lb 5.2 oz (57.3 kg) (11/27 2100)  Physical Exam: General: No acute respiratory distress-very thin body habitus Lungs: Fine diffuse crackles with good air movement overall and minimal expiratory wheeze diffusely Cardiovascular: Regular rate and rhythm without murmur gallop or rub normal S1 and S2 Abdomen: Nontender, nondistended, soft, bowel sounds positive, no rebound, no ascites, no appreciable mass-very thin Extremities: No significant cyanosis, clubbing, or edema bilateral lower extremities  Lab Results:  Basename 05/13/11 0810  NA 135  K 3.7  CL 99  CO2 25  GLUCOSE 101*  BUN 8  CREATININE 0.89  CALCIUM 9.4  MG --  PHOS --   No results found for this basename: AST:2,ALT:2,ALKPHOS:2,BILITOT:2,PROT:2,ALBUMIN:2 in the last 72 hours No results found for this basename: LIPASE:2,AMYLASE:2 in the last 72 hours  Basename 05/14/11 0530 05/13/11 0810  WBC 9.8 16.0*    NEUTROABS -- --  HGB 11.8* 15.3  HCT 35.9* 45.3  MCV 88.6 88.8  PLT 231 306   No results found for this basename: CKTOTAL:3,CKMB:3,CKMBINDEX:3,TROPONINI:3 in the last 72 hours No results found for this basename: POCBNP:3 in the last 72 hours No results found for this basename: DDIMER:2 in the last 72 hours No results found for this basename: HGBA1C:12 in the last 72 hours No results found for this basename: CHOL:2,HDL:2,LDLCALC:2,TRIG:2,CHOLHDL:2,LDLDIRECT:2 in the last 72 hours No results found for this basename: TSH,T4TOTAL,FREET3,T3FREE,THYROIDAB in the last 72 hours No results found for this basename: VITAMINB12:2,FOLATE:2,FERRITIN:2,TIBC:2,IRON:2,RETICCTPCT:2 in the last 72 hours  Micro Results: No results found for this or any previous visit (from the past 240 hour(s)).  Studies/Results: Scheduled Meds:   . albuterol  5 mg Nebulization Once  . albuterol  5 mg Nebulization Q6H  . enoxaparin  40 mg Subcutaneous Q24H  . influenza  inactive virus vaccine  0.5 mL Intramuscular Tomorrow-1000  . ipratropium  0.5 mg Nebulization Once  . ipratropium  0.5 mg Nebulization Once  . ipratropium  0.5 mg Nebulization Q6H  . moxifloxacin  400 mg Intravenous Once  . pneumococcal 23 valent vaccine  0.5 mL Intramuscular Tomorrow-1000  . sodium chloride  500 mL Intravenous Once  . sodium chloride  500 mL Intravenous Once  . sodium chloride  3 mL Intravenous Q12H   Continuous Infusions:   . sodium chloride 20 mL/hr at 05/13/11 0809  PRN Meds:.acetaminophen, alum & mag hydroxide-simeth, ondansetron (ZOFRAN) IV, ondansetron, oxyCODONE, senna, sodium chloride, DISCONTD: albuterol  Assessment/Plan:  Cystic fibrosis with pulmonary exacerbation Chronically followed at [facility] cystic fibrosis clinic/pulmonology clinic (phone number [phone])-the admitting doctor discussed appropriate treatment plans with the pulmonologist at [facility] who has suggested Ceftaz and tobramycin-I will  begin these medications now-I. 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

## 2011-05-14 NOTE — Progress Notes (Signed)
ANTIBIOTIC CONSULT NOTE - INITIAL  Pharmacy Consult for Ceftazidime and Tobramycin Indication: Cystic fibrosis with pulmonary exacerbation  Allergies  Allergen Reactions  . Amoxapine And Related   . Amoxicillin Hives and Other (See Comments)    Gas   . Morphine And Related Other (See Comments)    Skin burns   . Penicillins Hives and Other (See Comments)    Gas     Patient Measurements: Height: 5\' 9"  (175.3 cm) Weight: 126 lb 5.2 oz (57.3 kg) IBW/kg (Calculated) : 70.7   Vital Signs: Temp: 98.9 F (37.2 C) (11/28 0533) Temp src: Oral (11/28 0533) BP: 93/51 mmHg (11/28 0533) Pulse Rate: 82  (11/28 0533) Intake/Output from previous day: 11/27 0701 - 11/28 0700 In: -  Out: 1 [Urine:1] Intake/Output from this shift:    Labs:  [facility] 05/14/11 0530 05/13/11 0810  WBC 9.8 16.0*  HGB 11.8* 15.3  PLT 231 306  LABCREA -- --  CREATININE -- 0.89   Estimated Creatinine Clearance: 107.3 ml/min (by C-G formula based on Cr of 0.89). No results found for this basename: VANCOTROUGH:2,VANCOPEAK:2,VANCORANDOM:2,GENTTROUGH:2,GENTPEAK:2,GENTRANDOM:2,TOBRATROUGH:2,TOBRAPEAK:2,TOBRARND:2,AMIKACINPEAK:2,AMIKACINTROU:2,AMIKACIN:2, in the last 72 hours   Microbiology: No results found for this or any previous visit (from the past 720 hour(s)).   Medical History: Past Medical History  Diagnosis Date  . Cystic fibrosis   . Acid reflux     Medications:  Scheduled:    . albuterol  5 mg Nebulization Q6H  . Aztreonam Lysine  75 mg Inhalation TID  . cefTAZidime  2 g Intramuscular Q8H  . cholecalciferol  4,000 Units Oral BID  . enoxaparin  40 mg Subcutaneous Q24H  . fluticasone  2 spray Each Nare Daily  . guaifenesin  400 mg Oral Q6H  . influenza  inactive virus vaccine  0.5 mL Intramuscular Tomorrow-1000  . ipratropium  0.5 mg Nebulization Once  . ipratropium  0.5 mg Nebulization Q6H  . lipase/protease/amylase  2 capsule Oral TID WC  . loratadine  10 mg Oral Daily  .  moxifloxacin  400 mg Intravenous Once  . multivitamins ther. w/minerals  1 tablet Oral Daily  . pantoprazole  40 mg Oral BID AC  . pneumococcal 23 valent vaccine  0.5 mL Intramuscular Tomorrow-1000  . sodium chloride  500 mL Intravenous Once  . sodium chloride  3 mL Intravenous Q12H  . sodium chloride HYPERTONIC  15 mL Nebulization QID  . ursodiol  300 mg Oral BID  . vitamin C  500 mg Oral Daily  . DISCONTD: albuterol  5 mg Nebulization Once  . DISCONTD: azithromycin  500 mg Oral Q M,W,F  . DISCONTD: ipratropium  0.5 mg Nebulization Once   Infusions:    . DISCONTD: sodium chloride 20 mL/hr at 05/13/11 0809    Assessment: Cystic fibrosis with pulmonary exacerbation.   Previous cultures have grown pseudomonas per notes.  Chronically followed at [facility] cystic fibrosis clinic/pulmonology clinic and the pulmonologist at [facility] who has suggested Ceftazidime and tobramycin.  These were orderd to be dosed by pharmacy. Has taken ceftazidime and tobramycin previously with good results per notes Outpatient was taking Cipro recently Total body weight is less than ideal body weight, therefore total weight will be used for antibiotic dosing.  Goal of Therapy:  Eradication of infection  Plan:  Ceftazidime 2g IV q8 hours Tobramycin 140mg  IV Q6 hours Measure antibiotic drug levels at steady state Follow renal function, and pharmacy will adjust doses as needed.  Lynann Beaver PharmD  Pager [phone] 05/14/2011 10:19 AM

## 2011-05-14 NOTE — Consult Note (Signed)
Patient name: [name] [name] Medical record number: [id number] Date of birth: 1990/09/26 Age: 20 y.o. Gender: male PCP: Leo Grosser, MD, MD  Date: 05/14/2011 Reason for Consult: CF with pneumonia Referring Physician: McLung      Culture data/sepsis markers   Antibiotics Aztreonam (CF/Pna) 11/28 >> Fortaz(CF/PNA) 11/28 >> Tobi Neb (CF/ PNA) 11/28 >>      Events/studies  HPI:  20 yowm quit smoking w/in the last year with CF [facility] with pseud[facility] on most recent cultures from [facility] admit [facility]27 with one week of worsening chest discomfort diffusely worse with coughing, ant > posterior and increasing sob with mnimal activity feeling better since admit by [facility] and placed on pseudomonas meds.  Sputum has become "more green than usual" but no hemoptysis or sinus complaints, no dysphagia.  Sleeping ok without nocturnal  or early am exacerbation  of respiratory  c/o's or need for noct saba. Also denies any obvious fluctuation of symptoms with weather or environmental changes or other aggravating or alleviating factors except as outlined above   .   Past Medical History  Diagnosis Date  . Cystic fibrosis   . Acid reflux     Past Surgical History  Procedure Date  . Right ear surgery     removal of excess skin  . Picc line placements     History reviewed. No pertinent family history.  Social History:  reports that he quit smoking about 4 weeks ago. His smoking use included Cigarettes. He has never used smokeless tobacco. He reports that he does not drink alcohol or use illicit drugs.  Allergies:  Allergies  Allergen Reactions  . Amoxapine And Related   . Amoxicillin Hives and Other (See Comments)    Gas   . Morphine And Related Other (See Comments)    Skin burns   . Penicillins Hives and Other (See Comments)    Gas     Medications:  Prior to Admission medications   Medication Sig Start Date End Date Taking? Authorizing Provider  aspirin 325 MG  tablet Take 325 mg by mouth every 6 (six) hours as needed. pain    Yes Historical Provider, MD  azithromycin (ZITHROMAX) 500 MG tablet Take 500 mg by mouth every Monday, Wednesday, and Friday.     Yes Historical Provider, MD  Aztreonam Lysine (CAYSTON) 75 MG SOLR Inhale 75 mg into the lungs 3 (three) times daily. Pt does every other month   Yes Historical Provider, MD  budesonide (RHINOCORT AQUA) 32 MCG/ACT nasal spray Place 1 spray into the nose daily.     Yes Historical Provider, MD  cetirizine (ZYRTEC) 10 MG tablet Take 10 mg by mouth at bedtime.     Yes Historical Provider, MD  cholecalciferol (VITAMIN D) 1000 UNITS tablet Take 4,000 Units by mouth 2 (two) times daily.     Yes Historical Provider, MD  guaifenesin (HUMIBID E) 400 MG TABS Take 400 mg by mouth every 6 (six) hours as needed. Chest congestion    Yes Historical Provider, MD  Multiple Vitamins-Minerals (MULTIVITAMINS THER. W/MINERALS) TABS Take 1 tablet by mouth daily.     Yes Historical Provider, MD  naproxen sodium (ANAPROX) 220 MG tablet Take 220-440 mg by mouth 2 (two) times daily as needed. headache    Yes Historical Provider, MD  Pancrelipase, Lip-Prot-Amyl, (CREON PO) Take 8 capsules by mouth. Lipase 24,000, protease 76,000, amylase 120,000, 8 capsule with meals , 6 capsules with snacks.    Yes Historical Provider, MD  pantoprazole (  PROTONIX) 40 MG tablet Take 40 mg by mouth 2 (two) times daily.     Yes Historical Provider, MD  ursodiol (ACTIGALL) 300 MG capsule Take 300 mg by mouth 2 (two) times daily.     Yes Historical Provider, MD  vitamin C (ASCORBIC ACID) 500 MG tablet Take 500 mg by mouth daily.     Yes Historical Provider, MD  albuterol (PROVENTIL HFA;VENTOLIN HFA) 108 (90 BASE) MCG/ACT inhaler Inhale 2 puffs into the lungs every 6 (six) hours as needed. Wheezing      Historical Provider, MD  diphenhydramine-acetaminophen (TYLENOL PM EXTRA STRENGTH) 25-500 MG TABS Take 2 tablets by mouth at bedtime as needed. sleep      Historical Provider, MD  ibuprofen (ADVIL,MOTRIN) 200 MG tablet Take 800 mg by mouth every 6 (six) hours as needed. pain     Historical Provider, MD      Temp:  [98.1 F (36.7 C)-98.9 F (37.2 C)] 98.1 F (36.7 C) (11/28 1404) Pulse Rate:  [72-82] 81  (11/28 1404) Resp:  [16-18] 18  (11/28 1404) BP: (93-104)/(51-63) 104/63 mmHg (11/28 1404) SpO2:  [96 %-99 %] 96 % (11/28 1404) Weight:  [126 lb 5.2 oz (57.3 kg)] 126 lb 5.2 oz (57.3 kg) (11/27 2100)    Intake/Output Summary (Last 24 hours) at 05/14/11 2034 Last data filed at 05/14/11 1100  Gross per 24 hour  Intake    240 ml  Output      3 ml  Net    237 ml   Physical exam Young wm pulling covers up over his head during interview in nad. Mod congested congested sounding cough.  HEENT mild turbinate edema.  Oropharynx no thrush or excess pnd or cobblestoning.  No JVD or cervical adenopathy. Mild accessory muscle hypertrophy. Trachea midline, nl thryroid. Chest was hyperinflated by percussion with diminished breath sounds and moderate increased exp time without wheeze. Hoover sign positive at mid inspiration. Regular rate and rhythm without murmur gallop or rub or increase P2 or edema.  Abd: no hsm, nl excursion. Ext warm without cyanosis. Moderate clubbing   radiology  Reticular interstitial pattern within the lungs and bronchiectatic  changes appear stable. No definite acute superimposed process is  identified.   LAB RESULT Lab Results  Component Value Date   CREATININE 0.89 05/13/2011   BUN 8 05/13/2011   NA 135 05/13/2011   K 3.7 05/13/2011   CL 99 05/13/2011   CO2 25 05/13/2011   Lab Results  Component Value Date   WBC 9.8 05/14/2011   HGB 11.8* 05/14/2011   HCT 35.9* 05/14/2011   MCV 88.6 05/14/2011   PLT 231 05/14/2011   Lab Results  Component Value Date   ALT 41 09/04/2010   AST 38* 09/04/2010   ALKPHOS 202* 09/04/2010   BILITOT 1.1 09/04/2010   No results found for this basename: INR, PROTIME      Assessment and Plan   CF with acute worsening purulent tracheobronchitis and recent pseudomonas on sputum culture with abx rx being directed by pulmonary at [facility] - no role for steroids here.  Agree with rx as planned by [facility], pulmonary service available prn.   Sandrea Hughs, MD Pulmonary and Critical Care Medicine [facility] Cell [phone]

## 2011-05-15 ENCOUNTER — Inpatient Hospital Stay (HOSPITAL_COMMUNITY): Payer: Medicaid Other

## 2011-05-15 LAB — CBC
Hemoglobin: 12.6 g/dL — ABNORMAL LOW (ref 13.0–17.0)
MCHC: 32.9 g/dL (ref 30.0–36.0)
RBC: 4.33 MIL/uL (ref 4.22–5.81)

## 2011-05-15 LAB — BASIC METABOLIC PANEL
BUN: 6 mg/dL (ref 6–23)
Calcium: 8.8 mg/dL (ref 8.4–10.5)
Creatinine, Ser: 0.74 mg/dL (ref 0.50–1.35)
GFR calc Af Amer: >90 mL/min (ref >90)
GFR calc non Af Amer: >90 mL/min (ref >90)

## 2011-05-15 LAB — TOBRAMYCIN LEVEL, TROUGH: Tobramycin Tr: 0.8 ug/mL (ref 0.5–2.0)

## 2011-05-15 LAB — HEPATIC FUNCTION PANEL
Bilirubin, Direct: 0.1 mg/dL (ref 0.0–0.3)
Indirect Bilirubin: 0.2 mg/dL — ABNORMAL LOW (ref 0.3–0.9)

## 2011-05-15 MED ORDER — SODIUM CHLORIDE 0.9 % IJ SOLN: 10.0000 mL | INTRAMUSCULAR | Status: DC | PRN

## 2011-05-15 MED ORDER — AZTREONAM LYSINE 75 MG IN SOLR
75.0000 mg | Freq: Three times a day (TID) | RESPIRATORY_TRACT | Status: DC
  Administered 2011-05-15 – 2011-05-16 (×3): 75 mg via RESPIRATORY_TRACT
  Filled 2011-05-15 (×2): qty 75

## 2011-05-15 NOTE — Progress Notes (Signed)
Subjective: No events overnight. No new complaints.  Breathing stable Objective:  Vital signs in last 24 hours:  Filed Vitals:   05/15/11 0733 05/15/11 1146 05/15/11 1421 05/15/11 1537  BP:   97/73   Pulse:   79   Temp:   98.2 F (36.8 C)   TempSrc:   Oral   Resp:   16   Height:      Weight:      SpO2: 96% 98% 96% 98%    Intake/Output from previous day:   Intake/Output Summary (Last 24 hours) at 05/15/11 1634 Last data filed at 05/15/11 1422  Gross per 24 hour  Intake    507 ml  Output      2 ml  Net    505 ml    Physical Exam: General: Alert, awake, oriented x3, in no acute distress. HEENT: No bruits, no goiter. Moist mucous membranes, no scleral icterus, no conjunctival pallor. Heart: Regular rate and rhythm, without murmurs, rubs, gallops. Lungs: Clear to auscultation bilaterally. No wheezing, no rhonchi, no rales.  Abdomen: Soft, nontender, nondistended, positive bowel sounds. Extremities: No clubbing cyanosis or edema,  positive pedal pulses. Neuro: Grossly intact, nonfocal.    Lab Results:  Basic Metabolic Panel:    Component Value Date/Time   NA 139 05/15/2011 0515   K 3.5 05/15/2011 0515   CL 103 05/15/2011 0515   CO2 26 05/15/2011 0515   BUN 6 05/15/2011 0515   CREATININE 0.74 05/15/2011 0515   GLUCOSE 101* 05/15/2011 0515   CALCIUM 8.8 05/15/2011 0515   CBC:    Component Value Date/Time   WBC 9.2 05/15/2011 0515   HGB 12.6* 05/15/2011 0515   HCT 38.3* 05/15/2011 0515   PLT 273 05/15/2011 0515   MCV 88.5 05/15/2011 0515   NEUTROABS 13.4* 05/03/2011 2245   LYMPHSABS 1.6 05/03/2011 2245   MONOABS 1.1* 05/03/2011 2245   EOSABS 0.1 05/03/2011 2245   BASOSABS 0.0 05/03/2011 2245      Lab 05/15/11 0515 05/14/11 0530 05/13/11 0810  WBC 9.2 9.8 16.0*  HGB 12.6* 11.8* 15.3  HCT 38.3* 35.9* 45.3  PLT 273 231 306  MCV 88.5 88.6 88.8  MCH 29.1 29.1 30.0  MCHC 32.9 32.9 33.8  RDW 12.7 12.9 12.8  LYMPHSABS -- -- --  MONOABS -- -- --    EOSABS -- -- --  BASOSABS -- -- --  BANDABS -- -- --    Lab 05/15/11 0515 05/13/11 0810  NA 139 135  K 3.5 3.7  CL 103 99  CO2 26 25  GLUCOSE 101* 101*  BUN 6 8  CREATININE 0.74 0.89  CALCIUM 8.8 9.4  MG -- --   No results found for this basename: INR:5,PROTIME:5 in the last 168 hours Cardiac markers: No results found for this basename: CK:3,CKMB:3,TROPONINI:3,MYOGLOBIN:3 in the last 168 hours No results found for this basename: POCBNP:3 in the last 168 hours No results found for this or any previous visit (from the past 240 hour(s)).  Studies/Results: Dg Chest 1 View  05/15/2011  *RADIOLOGY REPORT*  Clinical Data: Cystic fibrosis.  CHEST - 1 VIEW  Comparison: Chest x-ray 05/13/2011.  Findings: The cardiac silhouette, mediastinal and hilar contours are stable.  Persistent bronchitic type lung changes with peribronchial thickening and reticulonodular interstitial lung pattern.  Moderate peribronchial thickening may suggest acute inflammation.  No focal infiltrates or effusions.  IMPRESSION:  1.  Persistent and slightly progressive bronchitic changes may suggest acute inflammation / bronchitis superimposed on chronic changes. 2.  No focal infiltrates or effusions.  Original Report Authenticated By: P. Loralie Champagne, M.D.    Medications: Scheduled Meds:   . Aztreonam Lysine  75 mg Inhalation TID  . cefTAZidime (FORTAZ)  IV  2 g Intravenous Q8H  . cholecalciferol  4,000 Units Oral BID  . enoxaparin  40 mg Subcutaneous Q24H  . fluticasone  2 spray Each Nare Daily  . guaifenesin  400 mg Oral Q6H  . ipratropium  0.5 mg Nebulization Q6H  . lipase/protease/amylase  2 capsule Oral TID WC  . loratadine  10 mg Oral Daily  . multivitamins ther. w/minerals  1 tablet Oral Daily  . pantoprazole  40 mg Oral BID AC  . sodium chloride  3 mL Intravenous Q12H  . sodium chloride HYPERTONIC  15 mL Nebulization QID  . tobramycin  280 mg Intravenous Q12H  . ursodiol  300 mg Oral BID  . vitamin  C  500 mg Oral Daily  . DISCONTD: Aztreonam Lysine  75 mg Inhalation TID   Continuous Infusions:  PRN Meds:.acetaminophen, alum & mag hydroxide-simeth, ondansetron (ZOFRAN) IV, ondansetron, oxyCODONE, senna, sodium chloride  Assessment/Plan:  Cystic fibrosis with pulmonary exacerbation  Chronically followed at [facility] cystic fibrosis clinic/pulmonology clinic (phone number [phone])-the admitting doctor discussed appropriate treatment plans with the pulmonologist at [facility] who has suggested Ceftaz and tobramycin-These medications were started on admission-Plan is for 2 weeks of therapy, so patient will need a PICC line-Clinically the patient appears to be improving-Seen by pulmonary here in the hospital, no new recommendations.    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

## 2011-05-16 DIAGNOSIS — J40 Bronchitis, not specified as acute or chronic: Secondary | ICD-10-CM | POA: Diagnosis present

## 2011-05-16 LAB — BASIC METABOLIC PANEL
BUN: 9 mg/dL (ref 6–23)
CO2: 28 mEq/L (ref 19–32)
Calcium: 8.9 mg/dL (ref 8.4–10.5)
Chloride: 102 mEq/L (ref 96–112)
Creatinine, Ser: 0.83 mg/dL (ref 0.50–1.35)

## 2011-05-16 LAB — CBC
HCT: 36.1 % — ABNORMAL LOW (ref 39.0–52.0)
MCH: 29.7 pg (ref 26.0–34.0)
MCV: 87.8 fL (ref 78.0–100.0)
RBC: 4.11 MIL/uL — ABNORMAL LOW (ref 4.22–5.81)
RDW: 12.9 % (ref 11.5–15.5)
WBC: 8 10*3/uL (ref 4.0–10.5)

## 2011-05-16 MED ORDER — CEFTAZIDIME AND DEXTROSE 2 GM/50ML IV SOLR: 2.0000 g | Freq: Three times a day (TID) | INTRAVENOUS | Status: AC

## 2011-05-16 MED ORDER — TOBRAMYCIN SULFATE 80 MG/2ML IJ SOLN: 280.0000 mg | Freq: Two times a day (BID) | INTRAVENOUS | Status: AC

## 2011-05-16 MED ORDER — OXYCODONE HCL 5 MG PO TABS: 5.0000 mg | ORAL_TABLET | Freq: Four times a day (QID) | ORAL | Status: AC | PRN

## 2011-05-16 NOTE — Progress Notes (Signed)
Patient discharged home with family, alert and oriented, discharge orders given patient in stable condition at this time, patient verbalize understanding of discharge orders.

## 2011-05-16 NOTE — Discharge Summary (Signed)
Patient ID: [name] [name] MRN: [id number] DOB/AGE: 07/10/1990 20 y.o.  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    dextrose 5 % SOLN 50 mL with tobramycin 80 MG/2ML SOLN 280 mg Inject 280 mg into the vein every 12 (twelve) hours.    oxyCODONE (OXY IR/ROXICODONE) 5 MG immediate release tablet Take 1 tablet (5 mg total) by mouth every 6 (six) hours as needed. Qty: 30 tablet, Refills: 0      CONTINUE these medications which have NOT CHANGED   Details  aspirin 325 MG tablet Take 325 mg by mouth every 6 (six) hours as needed. pain     azithromycin (ZITHROMAX) 500 MG tablet Take 500 mg by mouth every Monday, Wednesday, and Friday.      Aztreonam Lysine (CAYSTON) 75 MG SOLR Inhale 75 mg into the lungs 3 (three) times daily. Pt does every other month    budesonide (RHINOCORT AQUA) 32 MCG/ACT nasal spray Place 1 spray into the nose daily.      cetirizine (ZYRTEC) 10 MG tablet Take 10 mg by mouth at bedtime.      cholecalciferol (VITAMIN D) 1000 UNITS tablet Take 4,000 Units by mouth 2 (two) times daily.      guaifenesin (HUMIBID E) 400 MG TABS Take 400 mg by mouth every 6 (six) hours as needed. Chest congestion     Multiple Vitamins-Minerals (MULTIVITAMINS THER. W/MINERALS) TABS Take 1 tablet by mouth daily.      naproxen sodium (ANAPROX) 220 MG tablet Take 220-440 mg by mouth 2 (two) times daily as needed. headache     Pancrelipase, Lip-Prot-Amyl, (CREON PO) Take 8 capsules by mouth. Lipase 24,000, protease 76,000, amylase 120,000, 8 capsule with meals , 6 capsules with snacks.     pantoprazole (PROTONIX) 40 MG  tablet Take 40 mg by mouth 2 (two) times daily.      ursodiol (ACTIGALL) 300 MG capsule Take 300 mg by mouth 2 (two) times daily.      vitamin C (ASCORBIC ACID) 500 MG tablet Take 500 mg by mouth daily.      albuterol (PROVENTIL HFA;VENTOLIN HFA) 108 (90 BASE) MCG/ACT inhaler Inhale 2 puffs into the lungs every 6 (six) hours as needed. Wheezing      diphenhydramine-acetaminophen (TYLENOL PM EXTRA STRENGTH) 25-500 MG TABS Take 2 tablets by mouth at bedtime as needed. sleep     ibuprofen (ADVIL,MOTRIN) 200 MG tablet Take 800 mg by mouth every 6 (six) hours as needed. pain       STOP taking these medications     Calcium Citrate-Vitamin D (CALCIUM CITRATE + D PO)      ciprofloxacin (CIPRO) 500 MG tablet         Disposition and Follow-up: Follow up with primary doctor in 2 weeks.  Patient reports that he already has a follow up appointment with his primary pulmonologist at [facility]  Consults:  Pulmonary, Dr. Sandrea Hughs  Significant Diagnostic Studies:  Dg Chest 2 View  05/13/2011  *RADIOLOGY REPORT*  Clinical Data: History of cystic fibrosis, shortness of breath and nausea.  CHEST - 2 VIEW  Comparison: 05/03/2011.  Findings: Reticular interstitial  pattern within the lungs and bronchiectatic changes appear stable.  No definite acute superimposed process is identified.  No consolidation or pleural effusion is evident. The cardiac silhouette is normal size and shape.  Mediastinal and hilar contours appear stable. Bones appear average for age.  IMPRESSION: Reticular interstitial pattern within the lungs and bronchiectatic changes appear stable.  No definite acute superimposed process is identified.  Original Report Authenticated By: Crawford Givens, M.D.    Brief H and P: For complete details please refer to admission H and P, but in brief, this is a 20 y/o Male with cystic fibrosis who comes in with complaint of increasing shortness of breath and pain in the center of his chest. The  patient was at [facility] and week ago with similar complaints. He was discharged on Avelox but later switched to ciprofloxacin by his pulmonologist. He states that he has been taking the ciprofloxacin for one week now but his symptoms have worsened. He's received nebulizer treatments and Vicodin in the ER and his dyspnea and pain have both improved. He is able to talk to me in complete sentences. He states his pain is now 5/10 and more bearable. He states that he is coughing up greenish colored sputum and has felt feverish. He is being admitted for pulmonary exacerbation of cystic fibrosis.  I have spoken with his pulmonologist who states that his last sputum culture about 3 months ago grew out Pseudomonas. This was during his last admission in [place]. She states that he did well with tobramycin and ceftaz and recommends that we give him both of these for a total of 2 weeks.    Physical Exam on Discharge:  Filed Vitals:   05/15/11 2037 05/15/11 2135 05/16/11 0545 05/16/11 0848  BP:  98/65 92/63   Pulse:  68 68   Temp:  98 F (36.7 C) 98.5 F (36.9 C)   TempSrc:  Oral Oral   Resp:  18 18   Height:      Weight:      SpO2: 94% 96% 95% 93%     Intake/Output Summary (Last 24 hours) at 05/16/11 1236 Last data filed at 05/16/11 0900  Gross per 24 hour  Intake    504 ml  Output      3 ml  Net    501 ml    General: Alert, awake, oriented x3, in no acute distress. HEENT: No bruits, no goiter. Heart: Regular rate and rhythm, without murmurs, rubs, gallops. Lungs: Clear to auscultation bilaterally. Abdomen: Soft, nontender, nondistended, positive bowel sounds. Extremities: No clubbing cyanosis or edema with positive pedal pulses. Neuro: Grossly intact, nonfocal.  CBC:    Component Value Date/Time   WBC 8.0 05/16/2011 0440   HGB 12.2* 05/16/2011 0440   HCT 36.1* 05/16/2011 0440   PLT 264 05/16/2011 0440   MCV 87.8 05/16/2011 0440   NEUTROABS 13.4* 05/03/2011 2245    LYMPHSABS 1.6 05/03/2011 2245   MONOABS 1.1* 05/03/2011 2245   EOSABS 0.1 05/03/2011 2245   BASOSABS 0.0 05/03/2011 2245    Basic Metabolic Panel:    Component Value Date/Time   NA 136 05/16/2011 0440   K 3.6 05/16/2011 0440   CL 102 05/16/2011 0440   CO2 28 05/16/2011 0440   BUN 9 05/16/2011 0440   CREATININE 0.83 05/16/2011 0440   GLUCOSE 96 05/16/2011 0440   CALCIUM 8.9 05/16/2011 0440    Hospital Course:   Cystic fibrosis with pulmonary exacerbation  Chronically followed at [facility]/pulmonology clinic (phone number [phone])-the admitting doctor discussed appropriate treatment plans with the pulmonologist at [facility] who has suggested Ceftaz and tobramycin-These medications were started on admission-Plan is for 2 weeks of therapy, so patient was placed with a PICC line-Clinically the patient appears to be improving-Seen by pulmonary here in the hospital, no new recommendations. He is afebrile, he is not dyspneic and is resting comfortably.  We will plan on discharge home today, with continued antibiotic treatments via PICC line at home.   Chest pain  This appears to have essentially resolved-I suspect this is a sequelae of his pulmonary disease   Pancreatic insufficiency due to cystic fibrosis  We continued his usual pancreatic enzyme replacement therapy   bipolar disorder + ADHD  It does not appear that the patient is on any chronic pharmacologic treatment for these diagnoses-there is no clinical evidence of exacerbation of either of these illnesses at this time   Recent tobacco abuse history  The patient reports he quit smoking 4 weeks ago-he is educated on the fact that he absolutely must not ever smoke again  Time spent on Discharge:  Signed: Kazden Largo 05/16/2011, 12:36 PM

## 2011-05-16 NOTE — Progress Notes (Signed)
Pt selected [facility] for Home Health RN, PICC care, family training and IV ABX. Referral given to [facility] from [facility]   Pt plan to go live with relative in [place]. mp

## 2011-05-16 NOTE — Progress Notes (Signed)
ANTIBIOTIC CONSULT NOTE - Follow up  Pharmacy Consult for Ceftazidime and Tobramycin Indication: Cystic fibrosis with pulmonary exacerbation  Allergies  Allergen Reactions  . Amoxapine And Related   . Amoxicillin Hives and Other (See Comments)    Gas   . Morphine And Related Other (See Comments)    Skin burns   . Penicillins Hives and Other (See Comments)    Gas     Patient Measurements: Height: 5\' 9"  (175.3 cm) Weight: 126 lb 5.2 oz (57.3 kg) IBW/kg (Calculated) : 70.7   Vital Signs: Temp: 98.1 F (36.7 C) (11/30 1355) Temp src: Oral (11/30 1355) BP: 91/63 mmHg (11/30 1355) Pulse Rate: 93  (11/30 1355) Intake/Output from previous day: 11/29 0701 - 11/30 0700 In: 507 [P.O.:240; I.V.:3; IV Piggyback:264] Out: 2 [Urine:2] Intake/Output from this shift: Total I/O In: -  Out: 1 [Urine:1]  Labs:  [facility] 05/16/11 0440 05/15/11 0515 05/14/11 0530  WBC 8.0 9.2 9.8  HGB 12.2* 12.6* 11.8*  PLT 264 273 231  LABCREA -- -- --  CREATININE 0.83 0.74 --   Estimated Creatinine Clearance: 115.1 ml/min (by C-G formula based on Cr of 0.83).  Basename 05/15/11 2002 05/15/11 0515 05/14/11 1250  VANCOTROUGH -- -- --  Leodis Binet -- -- --  Drue Dun -- -- --  GENTTROUGH -- -- --  GENTPEAK -- -- --  GENTRANDOM -- -- --  TOBRATROUGH -- 0.8 --  TOBRAPEAK 21.1* -- 7.6  TOBRARND -- -- --  AMIKACINPEAK -- -- --  AMIKACINTROU -- -- --  AMIKACIN -- -- --     Microbiology: No results found for this or any previous visit (from the past 720 hour(s)).   Medical History: Past Medical History  Diagnosis Date  . Cystic fibrosis   . Acid reflux     Medications:  Scheduled:     . Aztreonam Lysine  75 mg Inhalation TID  . cefTAZidime (FORTAZ)  IV  2 g Intravenous Q8H  . cholecalciferol  4,000 Units Oral BID  . enoxaparin  40 mg Subcutaneous Q24H  . fluticasone  2 spray Each Nare Daily  . guaifenesin  400 mg Oral Q6H  . ipratropium  0.5 mg Nebulization Q6H  .  lipase/protease/amylase  2 capsule Oral TID WC  . loratadine  10 mg Oral Daily  . multivitamins ther. w/minerals  1 tablet Oral Daily  . pantoprazole  40 mg Oral BID AC  . sodium chloride  3 mL Intravenous Q12H  . sodium chloride HYPERTONIC  15 mL Nebulization QID  . tobramycin  280 mg Intravenous Q12H  . ursodiol  300 mg Oral BID  . vitamin C  500 mg Oral Daily   Infusions:    Assessment: Cystic fibrosis with pulmonary exacerbation.   Previous cultures have grown pseudomonas per notes from [facility]  Currently using Q12 hr dosing of Tobramycin per recommendation from [facility].  Most recent trough on 11/29 was within normal limits at 0.8, but peak level 11/29 was high at 21.   Based on reports that with this same dose, he had a peak of ~11, and his SCr has not increased, I question the reliability of this one lab draw.  Plan:  Patient is being discharged with PICC line and [facility] to [facility] antibiotics: Ceftazidime 2g IV q8 hours Tobramycin 280mg  IV Q12 hours Obtain new peak level.  Next dose is due 11/30 at 1800, infuses for 30 min, and then peak should be drawn 30 min after that.  If  med is started at 1800, draw peak at 1900. Adjust doses as necessary for levels and renal function.  Lynann Beaver PharmD  Pager [phone] 05/16/2011 2:37 PM

## 2011-09-01 ENCOUNTER — Encounter (HOSPITAL_COMMUNITY): Payer: Self-pay | Admitting: *Deleted

## 2011-09-01 ENCOUNTER — Emergency Department (HOSPITAL_COMMUNITY): Payer: Medicaid Other

## 2011-09-01 ENCOUNTER — Emergency Department (HOSPITAL_COMMUNITY)
Admission: EM | Admit: 2011-09-01 | Discharge: 2011-09-01 | Disposition: A | Discharge status: Home / Self Care | Payer: Medicaid Other | Attending: Emergency Medicine | Admitting: Emergency Medicine

## 2011-09-01 DIAGNOSIS — R062 Wheezing: Secondary | ICD-10-CM | POA: Insufficient documentation

## 2011-09-01 DIAGNOSIS — R000 Tachycardia, unspecified: Secondary | ICD-10-CM | POA: Insufficient documentation

## 2011-09-01 DIAGNOSIS — Z87891 Personal history of nicotine dependence: Secondary | ICD-10-CM | POA: Insufficient documentation

## 2011-09-01 DIAGNOSIS — R0602 Shortness of breath: Secondary | ICD-10-CM | POA: Insufficient documentation

## 2011-09-01 DIAGNOSIS — R059 Cough, unspecified: Secondary | ICD-10-CM | POA: Insufficient documentation

## 2011-09-01 DIAGNOSIS — R05 Cough: Secondary | ICD-10-CM | POA: Insufficient documentation

## 2011-09-01 DIAGNOSIS — K219 Gastro-esophageal reflux disease without esophagitis: Secondary | ICD-10-CM | POA: Insufficient documentation

## 2011-09-01 DIAGNOSIS — R1110 Vomiting, unspecified: Secondary | ICD-10-CM | POA: Insufficient documentation

## 2011-09-01 DIAGNOSIS — R079 Chest pain, unspecified: Secondary | ICD-10-CM | POA: Insufficient documentation

## 2011-09-01 NOTE — ED Provider Notes (Signed)
History     CSN: [id number]  Arrival date & time 09/01/11  1309   First MD Initiated Contact with Patient 09/01/11 1359      Chief Complaint  Patient presents with  . Cough  . Emesis    (Consider location/radiation/quality/duration/timing/severity/associated sxs/prior treatment) Patient is a 21 y.o. male presenting with cough and vomiting. The history is provided by the patient and a parent.  Cough The current episode started more than 1 week ago. The problem occurs constantly. The cough is non-productive. Associated symptoms include chest pain, shortness of breath and wheezing. Pertinent negatives include no chills. He is not a smoker.  Emesis  Associated symptoms include cough. Pertinent negatives include no chills.  pt has h/o CF, denies new dyspnea  Past Medical History  Diagnosis Date  . Cystic fibrosis   . Acid reflux     Past Surgical History  Procedure Date  . Right ear surgery     removal of excess skin  . Picc line placements     History reviewed. No pertinent family history.  History  Substance Use Topics  . Smoking status: Former Smoker    Types: Cigarettes    Quit date: 04/12/2011  . Smokeless tobacco: Never Used  . Alcohol Use: No      Review of Systems  Constitutional: Negative for chills.  Respiratory: Positive for cough, shortness of breath and wheezing.   Cardiovascular: Positive for chest pain.  Gastrointestinal: Positive for vomiting.  All other systems reviewed and are negative.    Allergies  Amoxapine and related; Amoxicillin; Morphine and related; and Penicillins  Home Medications   Current Outpatient Rx  Name Route Sig Dispense Refill  . ALBUTEROL SULFATE HFA 108 (90 BASE) MCG/ACT IN AERS Inhalation Inhale 2 puffs into the lungs every 6 (six) hours as needed. Wheezing      . ASPIRIN 325 MG PO TABS Oral Take 325 mg by mouth every 6 (six) hours as needed. pain     . AZITHROMYCIN 500 MG PO TABS Oral Take 500 mg by mouth every  Monday, Wednesday, and Friday.      Marland Kitchen AZTREONAM LYSINE 75 MG IN SOLR Inhalation Inhale 75 mg into the lungs 3 (three) times daily. Pt does every other month    . BUDESONIDE 32 MCG/ACT NA SUSP Nasal Place 1 spray into the nose daily.      Marland Kitchen CETIRIZINE HCL 10 MG PO TABS Oral Take 10 mg by mouth at bedtime.      Marland Kitchen DIPHENHYDRAMINE-APAP (SLEEP) 25-500 MG PO TABS Oral Take 2 tablets by mouth at bedtime as needed. sleep     . IBUPROFEN 200 MG PO TABS Oral Take 800 mg by mouth every 6 (six) hours as needed. pain     . THERA M PLUS PO TABS Oral Take 1 tablet by mouth daily.      Marland Kitchen CREON PO Oral Take 4-6 capsules by mouth See admin instructions. Lipase 24,000, protease 76,000, amylase 120,000, 6 capsule with meals , 4 capsules with snacks.    Marland Kitchen PANTOPRAZOLE SODIUM 40 MG PO TBEC Oral Take 40 mg by mouth 2 (two) times daily.      Marland Kitchen URSODIOL 300 MG PO CAPS Oral Take 300 mg by mouth 2 (two) times daily.      Marland Kitchen VITAMIN C 500 MG PO TABS Oral Take 500 mg by mouth daily.        BP 107/71  Pulse 119  Temp(Src) 97.9 F (36.6 C) (Oral)  Resp  16  SpO2 100%  Physical Exam  Nursing note and vitals reviewed. Constitutional: He is oriented to person, place, and time. He appears well-developed and well-nourished.  Non-toxic appearance. No distress.  HENT:  Head: Normocephalic and atraumatic.  Eyes: Conjunctivae, EOM and lids are normal. Pupils are equal, round, and reactive to light.  Neck: Normal range of motion. Neck supple. No tracheal deviation present. No mass present.  Cardiovascular: Regular rhythm and normal heart sounds.  Tachycardia present.  Exam reveals no gallop.   No murmur heard. Pulmonary/Chest: Effort normal and breath sounds normal. No stridor. No respiratory distress. He has no decreased breath sounds. He has no wheezes. He has no rhonchi. He has no rales.  Abdominal: Soft. Normal appearance and bowel sounds are normal. He exhibits no distension. There is no tenderness. There is no rebound and  no CVA tenderness.  Musculoskeletal: Normal range of motion. He exhibits no edema and no tenderness.  Neurological: He is alert and oriented to person, place, and time. He has normal strength. No cranial nerve deficit or sensory deficit. GCS eye subscore is 4. GCS verbal subscore is 5. GCS motor subscore is 6.  Skin: Skin is warm and dry. No abrasion and no rash noted.  Psychiatric: He has a normal mood and affect. His speech is normal and behavior is normal.    ED Course  Procedures (including critical care time)  Labs Reviewed - No data to display No results found.   No diagnosis found.    MDM  Pt admits to not being compliant with his cf meds--in no acute distress--will f/u at [facility]        Toy Baker, MD 09/01/11 1538

## 2011-09-01 NOTE — Discharge Instructions (Signed)
Take you medications as directed and followup with her doctors at [facility]  Cystic Fibrosis Cystic fibrosis (CF) is an inherited disease caused by a defective gene. This defective gene leads to a change in a protein that affects cells that make mucus and sweat. Mucus is usually thin and watery. It is needed to keep the body working properly. But in CF, mucus becomes thick and very sticky. It clogs up the glands, airways and other passageways in the body. This makes it difficult to breathe freely. It also makes lung infections more likely. CF can also make it hard for the body to get the fat and protein it needs from food. CF is often thought of as a lung disease. Breathing (respiratory) failure is its most serious consequence. But, in CF, other parts of the body can also be affected including the skin, pancreas, liver, intestines, sinuses, and sex organs. The signs and symptoms of CF vary from person to person. They also differ by age. CF is a lifelong disease. However, people can live long, productive lives with care and treatment. CAUSES  CF is a genetic disease. CF is inherited as a recessive trait. That means a child can have CF even if neither of the child's parents have the disease. Both parents simply carry a gene that can cause CF. You are at greater risk for CF if:  One of your brothers or sisters has the disease.   Your parents carry the gene that causes it.   One of your parents has the disease.  SYMPTOMS   Difficulty gaining weight.   Not growing as big as other children the same age.   Chronic gas or bloating.   Bulky or greasy bowel movements.   Tissue from the rectum protrudes from the anal opening (rectal prolapse).   Inflammation of the pancreas (recurrent pancreatitis).   Prolonged yellowing of the skin (jaundice).   Unusually salty sweat or skin.   Heat exhaustion with low salt in the blood.   Chronic coughing and/or wheezing.   Shortness of breath.    Frequent lung infections (bronchiolitis, bronchitis, pneumonia).   Frequent sinusitis and/or nasal polyps (small growths in the nose).   Club-shaped fingers or toes.  DIAGNOSIS  A caregiver will probably ask many questions and order some tests. These will help determine if CF is present. They also will provide valuable information about the nature and extent of the disease. Questions and tests may include:  Family history. You will be asked whether any relatives have CF.   Symptoms. You will be asked whether any of the symptoms listed above are present.   Sweat test. This is the standard test used to make a diagnosis of CF. It is also called a sweat chloride test. A chemical will be applied to make a patch of skin sweat. Then the sweat will be analyzed to see if it contains the chemicals present in CF.   DNA testing. This can be used to find what genetic mutations in CFTR are present.   Newborn screening. A blood test measures a chemical in the blood that is often elevated in newborns with CF. Extra testing is done if a blood test is abnormal or if a new baby shows signs of CF.   Nasal potential difference measurement. This test shows how CF is affecting the lining of the body's upper breathing passages.  Other tests that will be performed after a diagnosis of CF is made:  Blood tests to evaluate salt balance,  liver function, vitamin levels and blood counts.   X-rays. Chest and/or sinus x-rays can show how the lungs and breathing system are affected by the disease.   Lung (pulmonary) function tests. They tell how well the lungs are working.   Sputum culture. This test analyzes mucus from the lungs to find out if there is an infection.   Stool testing. This is done to see if the pancreas is working normally.   Fertility testing. In males, an ultrasound scan of the testes might be done.  RISKS AND COMPLICATIONS CF can lead to other problems. They might include:  Frequent lung  infections. These include pneumonia and bronchitis.   Frequent sinus infections (sinusitis).   Coughing up blood (hemoptysis).   Collapsed lung (pneumothorax). A small hole develops in the lung, and air leaks out.   Malnutrition. This can occur because the disease prevents the body from digesting food properly and absorbing needed nutrients.   Delayed growth. This is caused by malnutrition.   Nasal polyps.   Thick mucus can block the ducts into and out of the pancreas (pancreatitis). This causes the pancreas to become inflamed.   Diabetes. This develops if thick mucus keeps the pancreas from working properly. The pancreas controls the level of sugar in the blood.   Gallstones.   Liver damage. Thick mucus can block the duct that carries bile (a digestive fluid) out of the liver to be expelled from the body.   Rectal prolapse (the inner lining of the rectum pushes outside the body). Excessive coughing can cause this.   Fertility problems. In men, the tube that connects sexual organs can become clogged by mucus. Also, the vas deferens (a tube that moves sperm through the testes) may be missing in men with CF. Women with CF are sometimes less fertile than those without the disease.   Development of weak, brittle bones (osteoporosis). This occurs when the body does not absorb the vitamins needed to keep bones strong.  TREATMENT  Patients with CF should be managed through an accredited CF Center (to find a CF Center near you go to the Cystic Fibrosis Foundation website at www.GamingBus.hu). Treatment may include:  A high calorie diet. This can help maintain a healthy weight. That, in turn, helps reduce coughing and breathing problems.   A high salt diet or salt supplements. The body needs a balance of salt and other chemicals.   Drinking lots of fluids.   Exercise.   Enzyme capsules to aid digestion.   Nutritional supplements.   Medications to help with breathing.   Medications to  thin the mucus.   Vaccines to prevent infections.   Insulin medication, if diabetes is present.   Antibiotics to treat infections.   Chest percussion ("thumping") treatments to help move mucus.   Oxygen.   Surgery:   To relieve a digestive system blockage.   To remove nasal polyps.   Lung or liver transplants.  At times, people with CF need to be given some treatments in a hospital. HOME CARE INSTRUCTIONS  To get started with home care:  Learn how to give all medicines that have been prescribed.   Learn which symptoms require medical attention.   Learn how to give treatments to clear mucus.   Learn techniques to improve breathing.   Learn how to prepare the right kind of foods.   Join a support group for people with CF. Groups also exist for family members.   Do not smoke and keep yourself and  your child away from smoke.   Be as physically active as possible.   Keep shots (immunizations/vaccinations) up-to-date.   Wash hands often. This will reduce the chance of infection.  SEEK MEDICAL CARE IF:   Mucus contains blood.   There is a new cough or change in your cough.   The amount or consistency of mucus changes.   It becomes more difficult to breathe.   You or your child does not feel like eating or is losing weight.   You or your child has no energy.   You or your child has severe constipation.   You or your child has severe diarrhea.   You or your child is throwing up dark green fluid.   You or your child has an oral temperature above 102 F (38.9 C).   Your baby is older than 3 months with a rectal temperature of 100.5 F (38.1 C) or higher for more than 1 day.  SEEK IMMEDIATE MEDICAL CARE IF:   You or your child has an oral temperature above 102 F (38.9 C), not controlled by medicine.   Your baby is older than 3 months with a rectal temperature of 102 F (38.9 C) or higher.   Your baby is 71 months old or younger with a rectal temperature  of 100.4 F (38 C) or higher.  Document Released: 04/04/2008 Document Revised: 05/22/2011 Document Reviewed: 06/22/2008 [facility] Patient Information 2012 [place], [place].

## 2011-09-01 NOTE — ED Notes (Signed)
Pt in c/o cough and generalized illness x3 weeks, states sometimes he will cough until he vomits

## 2012-09-15 ENCOUNTER — Telehealth (INDEPENDENT_AMBULATORY_CARE_PROVIDER_SITE_OTHER): Payer: Self-pay | Admitting: General Surgery

## 2012-09-15 NOTE — Telephone Encounter (Signed)
Patient called stating she was in severe pain in her right side which is worse when she lies down.  Her narcotics are not working.  She did not feel that she would be able to wait until morning for her tests.  She was instructed to proceed to the ED for workup.

## 2013-01-10 ENCOUNTER — Encounter: Payer: Self-pay | Admitting: Physician Assistant

## 2013-01-10 ENCOUNTER — Ambulatory Visit (INDEPENDENT_AMBULATORY_CARE_PROVIDER_SITE_OTHER): Payer: Medicaid Other | Admitting: Physician Assistant

## 2013-01-10 VITALS — BP 122/90 | HR 80 | Temp 98.0°F | Resp 20 | Ht 66.5 in | Wt 124.0 lb

## 2013-01-10 DIAGNOSIS — L52 Erythema nodosum: Secondary | ICD-10-CM

## 2013-01-10 MED ORDER — NAPROXEN 500 MG PO TBEC: 500.0000 mg | DELAYED_RELEASE_TABLET | Freq: Two times a day (BID) | ORAL | Status: DC

## 2013-01-10 NOTE — Progress Notes (Signed)
Patient ID: [name] [name] MRN: [id number], DOB: 08-26-1990, 22 y.o. Date of Encounter: 01/10/2013, 4:24 PM    Chief Complaint:  Chief Complaint  Patient presents with  . painful sores all over  worse on legs     HPI: 22 y.o. year old white male reports that he was hospitalized at [facility] for 3 weeks sec to his Cystic Fibrosis. Got out of hospital middle of July.  Says he noticed first area on his skin when he was in process of being admitted to the hospital. While he was in hte hospital other areas developed. He whowed multile doctors but nobody could tell him what it was.   The rash is not itchy but those areas are painful/tender.   Home Meds: See attached medication section for any medications that were entered at today's visit. The computer does not put those onto this list.The following list is a list of meds entered prior to today's visit.   Current Outpatient Prescriptions on File Prior to Visit  Medication Sig Dispense Refill  . albuterol (PROVENTIL HFA;VENTOLIN HFA) 108 (90 BASE) MCG/ACT inhaler Inhale 2 puffs into the lungs every 6 (six) hours as needed. Wheezing        . aspirin 325 MG tablet Take 325 mg by mouth every 6 (six) hours as needed. pain       . azithromycin (ZITHROMAX) 500 MG tablet Take 500 mg by mouth every Monday, Wednesday, and Friday.        . Aztreonam Lysine (CAYSTON) 75 MG SOLR Inhale 75 mg into the lungs 3 (three) times daily. Pt does every other month      . budesonide (RHINOCORT AQUA) 32 MCG/ACT nasal spray Place 1 spray into the nose daily.        . cetirizine (ZYRTEC) 10 MG tablet Take 10 mg by mouth at bedtime.        . diphenhydramine-acetaminophen (TYLENOL PM EXTRA STRENGTH) 25-500 MG TABS Take 2 tablets by mouth at bedtime as needed. sleep       . ibuprofen (ADVIL,MOTRIN) 200 MG tablet Take 800 mg by mouth every 6 (six) hours as needed. pain       . Multiple Vitamins-Minerals (MULTIVITAMINS THER. W/MINERALS) TABS Take 1 tablet by mouth daily.         . Pancrelipase, Lip-Prot-Amyl, (CREON PO) Take 4-6 capsules by mouth See admin instructions. Lipase 24,000, protease 76,000, amylase 120,000, 6 capsule with meals , 4 capsules with snacks.      . pantoprazole (PROTONIX) 40 MG tablet Take 40 mg by mouth 2 (two) times daily.        . ursodiol (ACTIGALL) 300 MG capsule Take 300 mg by mouth 2 (two) times daily.        . vitamin C (ASCORBIC ACID) 500 MG tablet Take 500 mg by mouth daily.         No current facility-administered medications on file prior to visit.    Allergies:  Allergies  Allergen Reactions  . Amoxapine And Related   . Amoxicillin Hives and Other (See Comments)    Gas   . Morphine And Related Other (See Comments)    Skin burns   . Penicillins Hives and Other (See Comments)    Gas       Review of Systems: See HPI for pertinent ROS. All other ROS negative.    Physical Exam: Blood pressure 122/90, pulse 80, temperature 98 F (36.7 C), temperature source Oral, resp. rate 20, height 5' 6.5" (1.689  m), weight 124 lb (56.246 kg)., Body mass index is 19.72 kg/(m^2). General: Thin WM.  Appears in no acute distress. Lungs: Clear bilaterally to auscultation without wheezes, rales, or rhonchi. Breathing is unlabored. Heart: Regular rhythm. No murmurs, rubs, or gallops. Msk:  Strength and tone normal for age. Skin: Warm and dry.  Each area is approximately one inch in diameter. It is diffuse light pink color. It is slightly raised. He has such "areas" on his Right Forearm and his Left calf.  Neuro: Alert and oriented X 3. Moves all extremities spontaneously. Gait is normal. CNII-XII grossly in tact. Psych:  Responds to questions appropriately with a normal affect.     ASSESSMENT AND PLAN:  22 y.o. 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 I reviewed his chart. He has been seen in past with same rash. He had biopsy here 04/2010. 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   He then had labs including ANCA, CBC, ANA, Sed Rate which were all normal.   He had another episode 11/2011, which resolved within one week he reports.  I donot think he needs repeat biopsy or repeat labs now-will monitor, f/u if does not resolve. - naproxen (EC NAPROSYN) 500 MG EC tablet; Take 1 tablet (500 mg total) by mouth 2 (two) times daily with a meal.  Dispense: 60 tablet; Refill: 0   Signed, [address] [place], [place], [facility] [address] 01/10/2013 4:24 PM

## 2013-03-10 ENCOUNTER — Ambulatory Visit (INDEPENDENT_AMBULATORY_CARE_PROVIDER_SITE_OTHER): Payer: Medicaid Other | Admitting: Physician Assistant

## 2013-03-10 DIAGNOSIS — R002 Palpitations: Secondary | ICD-10-CM

## 2013-03-10 DIAGNOSIS — Z23 Encounter for immunization: Secondary | ICD-10-CM

## 2013-03-10 DIAGNOSIS — R040 Epistaxis: Secondary | ICD-10-CM

## 2013-03-10 NOTE — Progress Notes (Signed)
Patient ID: NIKI COSMAN MRN: [id number], DOB: 06-Apr-1991, 22 y.o. Date of Encounter: 03/10/2013, 4:40 PM    Chief Complaint:  Chief Complaint  Patient presents with  . c/o recurrent nose bleeds    heart racing causes coughing then vomiting     HPI: 22 y.o. year old white male has history of cystic fibrosis which is managed at [facility].  He reports that he had 4 nosebleeds yesterday. Says that he has been having a lot of sneezing and blowing his nose a lot. Says that 3 of the nosebleeds yesterday were from the left nostril. Said that he let it just drip for a long time before he even started to apply any pressure. When she applied pressure he was able to get the police to stop. Says that the one bleed from the right nostril quickly resolved.  As well he says that anytime he gets up from bed to go get something to drink, he notices that his heart rate goes up. Heart whenever he gets up from bed to get something to drink, he starts to cough then this has a choking sensation and ends up vomiting.     Home Meds: See attached medication section for any medications that were entered at today's visit. The computer does not put those onto this list.The following list is a list of meds entered prior to today's visit.   Current Outpatient Prescriptions on File Prior to Visit  Medication Sig Dispense Refill  . albuterol (PROVENTIL HFA;VENTOLIN HFA) 108 (90 BASE) MCG/ACT inhaler Inhale 2 puffs into the lungs every 6 (six) hours as needed. Wheezing        . aspirin 325 MG tablet Take 325 mg by mouth every 6 (six) hours as needed. pain       . azithromycin (ZITHROMAX) 500 MG tablet Take 500 mg by mouth every Monday, Wednesday, and Friday.        . Aztreonam Lysine (CAYSTON) 75 MG SOLR Inhale 75 mg into the lungs 3 (three) times daily. Pt does every other month      . budesonide (RHINOCORT AQUA) 32 MCG/ACT nasal spray Place 1 spray into the nose daily.        . cetirizine (ZYRTEC) 10 MG tablet  Take 10 mg by mouth at bedtime.        . diphenhydramine-acetaminophen (TYLENOL PM EXTRA STRENGTH) 25-500 MG TABS Take 2 tablets by mouth at bedtime as needed. sleep       . ibuprofen (ADVIL,MOTRIN) 200 MG tablet Take 800 mg by mouth every 6 (six) hours as needed. pain       . Multiple Vitamins-Minerals (MULTIVITAMINS THER. W/MINERALS) TABS Take 1 tablet by mouth daily.        . naproxen (EC NAPROSYN) 500 MG EC tablet Take 1 tablet (500 mg total) by mouth 2 (two) times daily with a meal.  60 tablet  0  . Pancrelipase, Lip-Prot-Amyl, (CREON PO) Take 4-6 capsules by mouth See admin instructions. Lipase 24,000, protease 76,000, amylase 120,000, 6 capsule with meals , 4 capsules with snacks.      . pantoprazole (PROTONIX) 40 MG tablet Take 40 mg by mouth 2 (two) times daily.        . ursodiol (ACTIGALL) 300 MG capsule Take 300 mg by mouth 2 (two) times daily.        . vitamin C (ASCORBIC ACID) 500 MG tablet Take 500 mg by mouth daily.         No current  facility-administered medications on file prior to visit.    Allergies:  Allergies  Allergen Reactions  . Amoxapine And Related   . Ondansetron Hives  . Amoxicillin Hives, Other (See Comments) and Rash    Gas   . Penicillins Hives, Other (See Comments) and Rash    Gas   . Sulfa Antibiotics Rash      Review of Systems: See HPI for pertinent ROS. All other ROS negative.    Physical Exam: Blood pressure 94/64, pulse 100, temperature 98.4 F (36.9 C), temperature source Oral, resp. rate 20, height 5' 6.5" (1.689 m), weight 110 lb (49.896 kg)., Body mass index is 17.49 kg/(m^2). General: Thin white male. Appears in no acute distress. HEENT: Nares are normal bilaterally. I see no mass. No active bleeding. Neck: Supple. No thyromegaly. No lymphadenopathy. Lungs: Clear bilaterally to auscultation without wheezes, rales, or rhonchi. Breathing is unlabored. Heart: Regular rhythm. No murmurs, rubs, or gallops. Msk:  Strength and tone normal  for age. Extremities/Skin: Warm and dry. No clubbing or cyanosis. No edema. No rashes or suspicious lesions. Neuro: Alert and oriented X 3. Moves all extremities spontaneously. Gait is normal. CNII-XII grossly in tact. Psych:  Responds to questions appropriately with a normal affect.     ASSESSMENT AND PLAN:  22 y.o. year old male with  1. Cystic fibrosis  2. Epistaxis I discussed with him causes of epistaxis. Discussed to avoid blowing his nose unless necessary. If he does have to blow his nose he needs to blow as gently as possible. Avoid rubbing up inside the nostril with tissues. Try to keep the inside of the nose moist with nasal saline. If he does have epistaxis, apply pressure and tilts his head back. Can even apply ice temporarily. If that would not resolve would need to go the emergency room for cauterization.  3. Palpitations Discussed with him that his heart rate is elevated secondary to cystic fibrosis. Reassured him. Even at today's visit resting heart rate is at 100 at rest. This is secondary to deconditioning/cystic fibrosis.  We'll administer influenza vaccine while here. He has had pneumococcal vaccine already.   [address] [place], [place], [facility] 03/10/2013 4:40 PM

## 2013-03-10 NOTE — Addendum Note (Signed)
Addended by: Donne Anon on: 03/10/2013 05:05 PM   Modules accepted: Orders

## 2013-03-14 ENCOUNTER — Telehealth: Payer: Self-pay | Admitting: Family Medicine

## 2013-03-14 NOTE — Telephone Encounter (Signed)
Patient not any better.  Mother called doctor in [place], Dr Kem Kays, cystic fibrosis spec.  He recommended we get blood work to check his Thyroid and make sure he is not dehydrated.  Can send him the results.  Please order labs, they want to come in the morning to have done.

## 2013-03-15 ENCOUNTER — Other Ambulatory Visit: Payer: Medicaid Other

## 2013-03-15 ENCOUNTER — Other Ambulatory Visit: Payer: Self-pay | Admitting: Family Medicine

## 2013-03-15 DIAGNOSIS — R5383 Other fatigue: Secondary | ICD-10-CM

## 2013-03-15 LAB — COMPLETE METABOLIC PANEL WITH GFR
Albumin: 3.5 g/dL (ref 3.5–5.2)
BUN: 11 mg/dL (ref 6–23)
CO2: 28 mEq/L (ref 19–32)
Calcium: 9.2 mg/dL (ref 8.4–10.5)
Chloride: 102 mEq/L (ref 96–112)
Creat: 0.87 mg/dL (ref 0.50–1.35)
GFR, Est African American: >89 mL/min
GFR, Est Non African American: >89 mL/min
Glucose, Bld: 90 mg/dL (ref 70–99)
Potassium: 5.5 mEq/L — ABNORMAL HIGH (ref 3.5–5.3)

## 2013-03-15 NOTE — Telephone Encounter (Signed)
Pt here this morning.  Dr Tanya Nones order CMP, TSH.

## 2013-03-15 NOTE — Telephone Encounter (Signed)
Order TSH and BMET Dx: Tachycardia

## 2013-05-25 ENCOUNTER — Ambulatory Visit (INDEPENDENT_AMBULATORY_CARE_PROVIDER_SITE_OTHER): Payer: Medicaid Other | Admitting: Family Medicine

## 2013-05-25 ENCOUNTER — Encounter: Payer: Self-pay | Admitting: Family Medicine

## 2013-05-25 VITALS — BP 100/80 | HR 82 | Temp 97.7°F | Resp 18 | Ht 66.0 in | Wt 118.0 lb

## 2013-05-25 DIAGNOSIS — R109 Unspecified abdominal pain: Secondary | ICD-10-CM

## 2013-05-25 DIAGNOSIS — R0781 Pleurodynia: Secondary | ICD-10-CM

## 2013-05-25 DIAGNOSIS — R0789 Other chest pain: Secondary | ICD-10-CM

## 2013-05-25 DIAGNOSIS — J988 Other specified respiratory disorders: Secondary | ICD-10-CM

## 2013-05-25 DIAGNOSIS — R079 Chest pain, unspecified: Secondary | ICD-10-CM

## 2013-05-25 DIAGNOSIS — L52 Erythema nodosum: Secondary | ICD-10-CM

## 2013-05-25 DIAGNOSIS — G4700 Insomnia, unspecified: Secondary | ICD-10-CM

## 2013-05-25 LAB — COMPREHENSIVE METABOLIC PANEL
ALT: 17 U/L (ref 0–53)
AST: 23 U/L (ref 0–37)
Albumin: 4 g/dL (ref 3.5–5.2)
Alkaline Phosphatase: 175 U/L — ABNORMAL HIGH (ref 39–117)
BUN: 10 mg/dL (ref 6–23)
CO2: 26 mEq/L (ref 19–32)
Calcium: 9.6 mg/dL (ref 8.4–10.5)
Chloride: 97 mEq/L (ref 96–112)
Creat: 0.96 mg/dL (ref 0.50–1.35)
Glucose, Bld: 92 mg/dL (ref 70–99)
Potassium: 4.5 mEq/L (ref 3.5–5.3)
Sodium: 135 mEq/L (ref 135–145)
Total Bilirubin: 0.4 mg/dL (ref 0.3–1.2)
Total Protein: 8.2 g/dL (ref 6.0–8.3)

## 2013-05-25 LAB — CBC WITH DIFFERENTIAL/PLATELET
Basophils Relative: 0 % (ref 0–1)
Eosinophils Absolute: 0.2 10*3/uL (ref 0.0–0.7)
Eosinophils Relative: 2 % (ref 0–5)
Lymphocytes Relative: 29 % (ref 12–46)
MCV: 80.7 fL (ref 78.0–100.0)
Neutro Abs: 5.3 10*3/uL (ref 1.7–7.7)
Neutrophils Relative %: 57 % (ref 43–77)
Platelets: 516 10*3/uL — ABNORMAL HIGH (ref 150–400)
RDW: 14.3 % (ref 11.5–15.5)
WBC: 9.3 10*3/uL (ref 4.0–10.5)

## 2013-05-25 MED ORDER — NAPROXEN 500 MG PO TBEC: 500.0000 mg | DELAYED_RELEASE_TABLET | Freq: Two times a day (BID) | ORAL | Status: DC

## 2013-05-25 NOTE — Patient Instructions (Signed)
Continue current medications Release of Dr. Kem Kays- [facility]- Cystic Fibrosis  Labs to be done Get the chest xray in the morning Naprosyn sent as needed F/U as needed

## 2013-05-25 NOTE — Assessment & Plan Note (Signed)
Per above continue levaquin They did ask about Mucinex. I reviewed articles on the cystic fibrosis and this has not been found to help

## 2013-05-25 NOTE — Assessment & Plan Note (Signed)
Followed by [facility]. We will obtain records from their office. He will continue the antibiotics. I will obtain a chest x-ray and some baseline labs

## 2013-05-25 NOTE — Assessment & Plan Note (Addendum)
I think this is more rib pain probably from the coughing versus actual abdominal pain. As he has had an trouble with abdominal pain in the past he has cystic fibrosis I have obtain some labs on him. He does not have any vomiting or change in bowel movements which is reassuring  Prn naprosyn for pain,also gets mild back pain on and off for years

## 2013-05-25 NOTE — Assessment & Plan Note (Signed)
I think he can restart the melatonin at bedtime and take consistently. I'm very weary of giving him another prescription medications for sleep at this time. We could consider doxepin but he did not do well with trazodone.

## 2013-05-25 NOTE — Progress Notes (Signed)
   Subjective:    Patient ID: [name] [name], male    DOB: Nov 20, 1990, 22 y.o.   MRN: [id number]  HPI Patient here secondary to right side/abdominal pain. He is a history of cystic fibrosis he is being followed by [facility] clinic. He had fever for the past 4 days that started over the weekEND as well as some cough with minimal production. He had 2 episodes of random sharp pain over his right side mostly bottom of his ribs. He denies any nausea vomiting associated denies any diarrhea. He's not had fever for the past 24 hours. He was seen by Kendell Bane 2 days ago was prescribed Levaquin to take secondary to the fever and cough. He was maintained on his other medications.  He is here with his grandmother today. They're also concerned about his sleep. He has difficulty falling asleep and getting restful sleep. He has tried trazodone in the past however this made him feel very funny and did not help very much. He has used melatonin on and off which didn't help his grandmother office to ask about valerian root  Review of Systems  GEN- denies fatigue,+ fever, weight loss,weakness, recent illness HEENT- denies eye drainage, change in vision, nasal discharge, CVS- denies chest pain, palpitations RESP- denies SOB,+ cough, wheeze ABD- denies N/V, change in stools, abd pain GU- denies dysuria, hematuria, dribbling, incontinence MSK- + joint pain, muscle aches, injury Neuro- denies headache, dizziness, syncope, seizure activity      Objective:   Physical Exam GEN- NAD, alert and oriented x3 HEENT- PERRL, EOMI, non injected sclera, pink conjunctiva, MMM, oropharynx clear Neck- Supple, LAD CVS- RRR, no murmur RESP-Decreased at bases, mild congestion, no wheeze, normal WOB Ribs- Non tender, no bruising ABD-NABS,soft,NT,ND EXT- No edema Pulses- Radial 2+        Assessment & Plan:

## 2013-05-26 ENCOUNTER — Ambulatory Visit
Admission: RE | Admit: 2013-05-26 | Discharge: 2013-05-26 | Disposition: A | Discharge status: Home / Self Care | Payer: Medicaid Other | Source: Ambulatory Visit | Attending: Family Medicine | Admitting: Family Medicine

## 2013-10-25 LAB — PULMONARY FUNCTION TEST

## 2013-12-26 ENCOUNTER — Ambulatory Visit: Payer: Medicaid Other | Admitting: Family Medicine

## 2014-01-02 ENCOUNTER — Ambulatory Visit (INDEPENDENT_AMBULATORY_CARE_PROVIDER_SITE_OTHER): Payer: Medicaid Other | Admitting: Family Medicine

## 2014-01-02 ENCOUNTER — Encounter: Payer: Self-pay | Admitting: Family Medicine

## 2014-01-02 VITALS — BP 116/70 | HR 62 | Temp 97.8°F | Resp 12 | Ht 68.0 in | Wt 119.0 lb

## 2014-01-02 DIAGNOSIS — F418 Other specified anxiety disorders: Secondary | ICD-10-CM | POA: Insufficient documentation

## 2014-01-02 DIAGNOSIS — G4700 Insomnia, unspecified: Secondary | ICD-10-CM

## 2014-01-02 DIAGNOSIS — F341 Dysthymic disorder: Secondary | ICD-10-CM

## 2014-01-02 MED ORDER — TRAZODONE HCL 50 MG PO TABS: 25.0000 mg | ORAL_TABLET | Freq: Every evening | ORAL | Status: DC | PRN

## 2014-01-02 MED ORDER — FLUOXETINE HCL 10 MG PO CAPS: 10.0000 mg | ORAL_CAPSULE | Freq: Every day | ORAL | Status: DC

## 2014-01-02 NOTE — Patient Instructions (Signed)
Take the prozac one a day Sleeping medicine as needed F/U 4 weeks

## 2014-01-03 NOTE — Progress Notes (Signed)
Patient ID: [name] [name], male   DOB: [birth year], 23 y.o.   MRN: [id number]   Subjective:    Patient ID: [name] [name], male    DOB: [birth year], 23 y.o.   MRN: [id number]  Patient presents for Anxiety  patient here to discuss anxiety and depression. He states in the past month he had a mental breakdown. He has a history of cystic fibrosis and he has a lot of family members are not very supportive of him as stated he is always sick and that he will die. He knows that his life expectancy with cystic fibrosis is lower than others and this he tries to deal with Dilantin and off but more recently has become real to him. He's also been having more exacerbations which she is followed by the clinic [facility]. He was at a family event a month or so ago and they were making fun of him and stating what medicine he does go die since then he's been very depressed and anxious. He's not been sleeping very well. He's been trying to stay to himself and mostly with his father who provides support for him. He denies any suicidal ideations. He does express some vivid dreams and states that he is fatigued during the day. He was on medication for sleep as a teenager when he was getting a lot of treatments but in general has been adverse to medications for depression and anxiety until now. He will see [facility] next week for his CF    Review Of Systems:  GEN- denies fatigue, fever, weight loss,weakness, recent illness HEENT- denies eye drainage, change in vision, nasal discharge, CVS- denies chest pain, palpitations RESP- denies SOB, cough, wheeze ABD- denies N/V, change in stools, abd pain Neuro- denies headache, dizziness, syncope, seizure activity       Objective:    BP 116/70  Pulse 62  Temp(Src) 97.8 F (36.6 C) (Oral)  Resp 12  Ht 5\' 8"  (1.727 m)  Wt 119 lb (53.978 kg)  BMI 18.10 kg/m2 GEN- NAD, alert and oriented x3 Psych- anxious appearing, not overly depressed, well groomed, normal speech, good  eye contact, no SI PHQ9 - 13       Assessment & Plan:      Problem List Items Addressed This Visit   Depression with anxiety - Primary      Note: This dictation was prepared with Dragon dictation along with smaller phrase technology. Any transcriptional errors that result from this process are unintentional.

## 2014-01-03 NOTE — Assessment & Plan Note (Signed)
Will start Prozac 10 mg once a day he will followup in 4 weeks.

## 2014-01-03 NOTE — Assessment & Plan Note (Signed)
He does tell me that he took a Klonopin from one of his family members to discuss these types of medications and I do not think he would be good long-term for him. We'll try him on trazodone one half to one tablet each bedtime when necessary he agrees with this.

## 2014-02-03 ENCOUNTER — Ambulatory Visit (INDEPENDENT_AMBULATORY_CARE_PROVIDER_SITE_OTHER): Payer: Medicaid Other | Admitting: Family Medicine

## 2014-02-03 ENCOUNTER — Encounter: Payer: Self-pay | Admitting: Family Medicine

## 2014-02-03 VITALS — BP 108/62 | HR 78 | Temp 97.9°F | Resp 16 | Ht 67.0 in | Wt 120.0 lb

## 2014-02-03 DIAGNOSIS — G4700 Insomnia, unspecified: Secondary | ICD-10-CM

## 2014-02-03 DIAGNOSIS — F341 Dysthymic disorder: Secondary | ICD-10-CM

## 2014-02-03 DIAGNOSIS — F418 Other specified anxiety disorders: Secondary | ICD-10-CM

## 2014-02-03 MED ORDER — LORAZEPAM 0.5 MG PO TABS: 0.5000 mg | ORAL_TABLET | Freq: Every day | ORAL | Status: DC

## 2014-02-03 MED ORDER — FLUOXETINE HCL 20 MG PO CAPS: 20.0000 mg | ORAL_CAPSULE | Freq: Every day | ORAL | Status: DC

## 2014-02-03 NOTE — Assessment & Plan Note (Signed)
Increase prozac to 20mg   Allergies continue the trazodone secondary to the side effects. I will put him on Ativan 0.5 mg at bedtime as we increase the Prozac to help with his anxiety and sleep. I want to avoid Xanax or Klonopin him at this time

## 2014-02-03 NOTE — Progress Notes (Signed)
Patient ID: [name] [name], male   DOB: [birth year], 23 y.o.   MRN: [id number]   Subjective:    Patient ID: [name] Groserik S Harner, male    DOB: [birth year], 23 y.o.   MRN: [id number]  Patient presents for 4 week F/U  patient here for interim followup on medications and her last visit he was started on Prozac 10 mg for depression and anxiety symptoms he was also started on trazodone at bedtime. He states he's not seen a difference with the Prozac but the trazodone causes him to have strange nightmares and weird dreams. He did stop taking it for a couple of nights and he did not have episodes when he restarted he did. He was recently admitted for the past 2 weeks secondary to a flare of his cystic fibrosis during the hospitalization they were given him the trazodone every night and he had terrible night mares and weird feelings with the medication. He like to try something different for his sleep, his mind races all the time he feels very anxious at nighttime.    Review Of Systems:  GEN- denies fatigue, fever, weight loss,weakness, recent illness HEENT- denies eye drainage, change in vision, nasal discharge, CVS- denies chest pain, palpitations RESP- denies SOB, cough, wheeze ABD- denies N/V, change in stools, abd pain Neuro- denies headache, dizziness, syncope, seizure activity       Objective:    BP 108/62  Pulse 78  Temp(Src) 97.9 F (36.6 C) (Oral)  Resp 16  Ht 5\' 7"  (1.702 m)  Wt 120 lb (54.432 kg)  BMI 18.79 kg/m2 GEN- NAD, alert and oriented x3 Psych- anxious appearing, not overly depressed, well groomed, normal WOB        Assessment & Plan:      Problem List Items Addressed This Visit   None      Note: This dictation was prepared with Dragon dictation along with smaller phrase technology. Any transcriptional errors that result from this process are unintentional.

## 2014-02-03 NOTE — Patient Instructions (Signed)
Increase prozac to 20mg  at bedtime Try ativan 0.5mg  at bedtime only as needed F/U 8 weeks

## 2014-02-07 ENCOUNTER — Telehealth: Payer: Self-pay | Admitting: *Deleted

## 2014-02-07 NOTE — Telephone Encounter (Signed)
Received call from patient.   Requested assistance in changing the payee for SSA benefits. States that he requires letter from MD to change his payee to himself from his grandmother.   Advised that form is used for MD communication to DSS. Requested to have form completed by MD. [name] that MD is not in office and will not return until Monday. Also advised that [employer] requests 7-10 days to complete forms.   States that this is agreeable and would like MD to complete form.   Form placed in MD basket for completion.

## 2014-02-08 ENCOUNTER — Telehealth: Payer: Self-pay | Admitting: *Deleted

## 2014-02-08 NOTE — Telephone Encounter (Signed)
Received call from patient.   Reports that he has been taking Prozac for mood stabilization.   Reports that he is noting that when he takes the Prozac, he has fits of rage and thoughts of harming self.   Advised that if he is currently having thoughts of self harm, he should go to [facility] for evaluation. States that he currently has no thoughts of self harm, but he has not taken Prozac in 2-3 days.   Please advise.

## 2014-02-08 NOTE — Telephone Encounter (Signed)
Stay off the Prozac. Schedule followup office visit.

## 2014-02-08 NOTE — Telephone Encounter (Signed)
Call placed to patient and patient made aware.   Appointment scheduled for 02/15/2014 with MD.

## 2014-02-13 NOTE — Telephone Encounter (Signed)
Will discuss at OV with pt, need more information regarding this

## 2014-02-13 NOTE — Telephone Encounter (Signed)
Noted discuss at OV needs to be on anti-depressant with anxilytic

## 2014-02-15 ENCOUNTER — Encounter: Payer: Self-pay | Admitting: Family Medicine

## 2014-02-15 ENCOUNTER — Ambulatory Visit (INDEPENDENT_AMBULATORY_CARE_PROVIDER_SITE_OTHER): Payer: Medicaid Other | Admitting: Family Medicine

## 2014-02-15 VITALS — BP 108/62 | HR 68 | Temp 98.2°F | Resp 14 | Ht 67.0 in | Wt 120.0 lb

## 2014-02-15 DIAGNOSIS — F418 Other specified anxiety disorders: Secondary | ICD-10-CM

## 2014-02-15 DIAGNOSIS — F341 Dysthymic disorder: Secondary | ICD-10-CM

## 2014-02-15 MED ORDER — ESCITALOPRAM OXALATE 5 MG PO TABS: 5.0000 mg | ORAL_TABLET | Freq: Every day | ORAL | Status: DC

## 2014-02-15 MED ORDER — LORAZEPAM 0.5 MG PO TABS: 0.5000 mg | ORAL_TABLET | Freq: Every day | ORAL | Status: DC

## 2014-02-15 NOTE — Progress Notes (Signed)
Patient ID: [name] [name], male   DOB: [birth year], 23 y.o.   MRN: [id number]   Subjective:    Patient ID: [name] [name], male    DOB: 20-May-1991, 23 y.o.   MRN: [id number]  Patient presents for F/U anti-depressant changes  Patient here to followup medications. He was seen about 2 weeks ago at that time his Prozac was increased to 20 mg. He called in last week he states that his family members think that he has been more aggressive and angry with increase in Prozac therefore have him discontinue the medication. He not having suicidal ideations no hallucinations no weird dreams. He's also been taking the Ativan but states it does not work as well without the Prozac. He does note that during the time where his family thought he was angry there was a lot of stress back and forth between him and his grandparents he is not going to specifics, He also notes that his anxiety and overwhelming symptoms did return when he went off of the Prozac  He also has a form today to switch over his payee for his grandmother who has been his payee for his of security check since age 79 to himself. He states they've been trying for couple years ago her to switch over and she would not go downtown recently the grandmother and the father went downtown to have this switched over and I performed stating that he is mentally able to manage his own money. He is actually also security since a child secondary to his cystic fibrosis   Review Of Systems:  GEN- denies fatigue, fever, weight loss,weakness, recent illness HEENT- denies eye drainage, change in vision, nasal discharge, CVS- denies chest pain, palpitations RESP- denies SOB, cough, wheeze ABD- denies N/V, change in stools, abd pain GU- denies dysuria, hematuria, dribbling, incontinence MSK- denies joint pain, muscle aches, injury Neuro- denies headache, dizziness, syncope, seizure activity       Objective:    BP 108/62  Pulse 68  Temp(Src) 98.2 F (36.8 C)  (Oral)  Resp 14  Ht  (1.702 m)  Wt 120 lb (54.432 kg)  BMI 18.79 kg/m2 GEN- NAD, alert and oriented x3 CVS- RRR, no murmur RESP-CTAB Psych- normal affect, not overly anxious, not depressed, normal speech Pulses- Radial 2+        Assessment & Plan:      Problem List Items Addressed This Visit   None      Note: This dictation was prepared with Dragon dictation along with smaller phrase technology. Any transcriptional errors that result from this process are unintentional.

## 2014-02-15 NOTE — Assessment & Plan Note (Addendum)
Trial of lexapro  once a day Continue ativan He benefits from being on something for his anxiety and depression symptoms, very difficult to tell if this was situational mood changes as he was arguing with family or the actual prozac  Form completed for Social services, I see no reason why he can not manage money

## 2014-02-15 NOTE — Patient Instructions (Signed)
Start the lexapro once a day Continue ativan F/U 6 weeks

## 2014-02-22 ENCOUNTER — Telehealth: Payer: Self-pay | Admitting: Family Medicine

## 2014-02-22 NOTE — Telephone Encounter (Signed)
Call placed to patient.   States that while he was taking the Lexapro, he was having hallucinations and was very nauseous.  MD please advise.

## 2014-02-22 NOTE — Telephone Encounter (Signed)
Patient is calling to let us know that the lexapro is making him sick. Please call him back at [phone]

## 2014-02-23 MED ORDER — BUSPIRONE HCL 7.5 MG PO TABS: 7.5000 mg | ORAL_TABLET | Freq: Two times a day (BID) | ORAL | Status: DC

## 2014-02-23 NOTE — Telephone Encounter (Signed)
Okay to stop medication for now Wait 1 week, then start buspar 7.5mg  BID, this is different from the prozac and lexapro, should not cause any nightmares or hallucinations but still help with his anxiety and stress Keep f/u appt

## 2014-02-23 NOTE — Telephone Encounter (Signed)
Call placed to patient and patient made aware.  

## 2014-02-28 ENCOUNTER — Telehealth: Payer: Self-pay | Admitting: *Deleted

## 2014-02-28 NOTE — Telephone Encounter (Signed)
Pt called stating he is needing a breathing test done for Social Services to continue his checks, ? Ok to send to [facility] pulmonary for breathing test for dx: cystic fibrosis?

## 2014-02-28 NOTE — Telephone Encounter (Signed)
No, he has a Pulmonary doctor at [facility], he needs to contact them for this test.

## 2014-02-28 NOTE — Telephone Encounter (Signed)
Pt aware of message and will contact his pulmonary doctor to schedule breathing test

## 2014-03-07 ENCOUNTER — Encounter: Payer: Self-pay | Admitting: Family Medicine

## 2014-03-07 ENCOUNTER — Ambulatory Visit (INDEPENDENT_AMBULATORY_CARE_PROVIDER_SITE_OTHER): Payer: Medicaid Other | Admitting: Family Medicine

## 2014-03-07 VITALS — BP 116/60 | HR 72 | Temp 97.5°F | Resp 16 | Ht 67.0 in | Wt 121.0 lb

## 2014-03-07 DIAGNOSIS — Z23 Encounter for immunization: Secondary | ICD-10-CM

## 2014-03-07 DIAGNOSIS — Z0000 Encounter for general adult medical examination without abnormal findings: Secondary | ICD-10-CM

## 2014-03-07 DIAGNOSIS — F418 Other specified anxiety disorders: Secondary | ICD-10-CM

## 2014-03-07 DIAGNOSIS — F341 Dysthymic disorder: Secondary | ICD-10-CM

## 2014-03-07 DIAGNOSIS — Z1159 Encounter for screening for other viral diseases: Secondary | ICD-10-CM

## 2014-03-07 NOTE — Patient Instructions (Signed)
Fax the name and number for social services respresentative We will call with lab results Flu shot given F/U [facility] from OCT to November

## 2014-03-07 NOTE — Progress Notes (Signed)
Patient ID: [name] [name], male   DOB: 1990-08-16, 23 y.o.   MRN: [id number]   Subjective:    Patient ID: [name] [name], male    DOB: May 11, 1991, 23 y.o.   MRN: [id number]  Patient presents for CPE  Pt here for CPE for social services, no form completed, this is to ensure he is healthy enough to be is own guardian and care for his finances. No concerns today Denies sexual activity Declines gonorrhea and chlamydia check Doing well on buspar      Review Of Systems:  GEN- denies fatigue, fever, weight loss,weakness, recent illness HEENT- denies eye drainage, change in vision, nasal discharge, CVS- denies chest pain, palpitations RESP- denies SOB, cough, wheeze ABD- denies N/V, change in stools, abd pain GU- denies dysuria, hematuria, dribbling, incontinence MSK- denies joint pain, muscle aches, injury Neuro- denies headache, dizziness, syncope, seizure activity       Objective:    BP 116/60  Pulse 72  Temp(Src) 97.5 F (36.4 C) (Oral)  Resp 16  Ht  (1.702 m)  Wt 121 lb (54.885 kg)  BMI 18.95 kg/m2 GEN- NAD, alert and oriented x3 HEENT- PERRL, EOMI, non injected sclera, pink conjunctiva, MMM, oropharynx clear Neck- Supple, no thyromegaly CVS- RRR, no murmur RESP-CTAB ABD-NABS,soft,NT,ND MSK- FROM upper and lower ext Neuro-CNII-XII in tact, no deficits, DTR symmetric   Psych- normal affect and mood GU- Deferred EXT- No edema Pulses- Radial 2+        Assessment & Plan:      Problem List Items Addressed This Visit   None    Visit Diagnoses   Routine general medical examination at a health care facility    -  Primary    CPE done, vision and hearing normal. Flu shot given, all other immunizatrions UTD HIV test done Passed hearing and vision    Relevant Orders       CBC with Differential       Comprehensive metabolic panel    Screening for viral disease        Relevant Orders       HIV antibody    Need for prophylactic vaccination and inoculation  against influenza           Note: This dictation was prepared with Dragon dictation along with smaller phrase technology. Any transcriptional errors that result from this process are unintentional.

## 2014-03-07 NOTE — Assessment & Plan Note (Signed)
Doing well on buspar 

## 2014-03-08 ENCOUNTER — Encounter: Payer: Self-pay | Admitting: Family Medicine

## 2014-03-08 LAB — CBC WITH DIFFERENTIAL/PLATELET
BASOS ABS: 0 10*3/uL (ref 0.0–0.1)
BASOS PCT: 0 % (ref 0–1)
Eosinophils Absolute: 0.3 10*3/uL (ref 0.0–0.7)
Eosinophils Relative: 2 % (ref 0–5)
HCT: 38.7 % — ABNORMAL LOW (ref 39.0–52.0)
Hemoglobin: 12.5 g/dL — ABNORMAL LOW (ref 13.0–17.0)
LYMPHS ABS: 2.3 10*3/uL (ref 0.7–4.0)
Lymphocytes Relative: 18 % (ref 12–46)
MCH: 27.6 pg (ref 26.0–34.0)
MCHC: 32.3 g/dL (ref 30.0–36.0)
MCV: 85.4 fL (ref 78.0–100.0)
MONOS PCT: 7 % (ref 3–12)
Monocytes Absolute: 0.9 10*3/uL (ref 0.1–1.0)
Neutro Abs: 9.2 10*3/uL — ABNORMAL HIGH (ref 1.7–7.7)
Neutrophils Relative %: 73 % (ref 43–77)
Platelets: 502 10*3/uL — ABNORMAL HIGH (ref 150–400)
RBC: 4.53 MIL/uL (ref 4.22–5.81)
RDW: 14.3 % (ref 11.5–15.5)
WBC: 12.6 10*3/uL — ABNORMAL HIGH (ref 4.0–10.5)

## 2014-03-08 LAB — COMPREHENSIVE METABOLIC PANEL
ALBUMIN: 3.4 g/dL — AB (ref 3.5–5.2)
ALK PHOS: 160 U/L — AB (ref 39–117)
ALT: 14 U/L (ref 0–53)
AST: 19 U/L (ref 0–37)
BUN: 9 mg/dL (ref 6–23)
CHLORIDE: 102 meq/L (ref 96–112)
CO2: 29 mEq/L (ref 19–32)
Calcium: 9.2 mg/dL (ref 8.4–10.5)
Creat: 1.06 mg/dL (ref 0.50–1.35)
Glucose, Bld: 90 mg/dL (ref 70–99)
POTASSIUM: 5.2 meq/L (ref 3.5–5.3)
SODIUM: 139 meq/L (ref 135–145)
Total Bilirubin: 0.3 mg/dL (ref 0.2–1.2)
Total Protein: 7.4 g/dL (ref 6.0–8.3)

## 2014-03-08 LAB — HIV ANTIBODY (ROUTINE TESTING W REFLEX): HIV 1&2 Ab, 4th Generation: NONREACTIVE

## 2014-03-31 ENCOUNTER — Ambulatory Visit: Payer: Medicaid Other | Admitting: Family Medicine

## 2014-04-06 ENCOUNTER — Ambulatory Visit (INDEPENDENT_AMBULATORY_CARE_PROVIDER_SITE_OTHER): Payer: Medicaid Other | Admitting: Physician Assistant

## 2014-04-06 ENCOUNTER — Encounter: Payer: Self-pay | Admitting: Physician Assistant

## 2014-04-06 VITALS — BP 126/70 | HR 72 | Temp 98.9°F | Resp 16 | Ht 67.0 in | Wt 118.0 lb

## 2014-04-06 DIAGNOSIS — J029 Acute pharyngitis, unspecified: Secondary | ICD-10-CM

## 2014-04-06 LAB — RAPID STREP SCREEN (MED CTR MEBANE ONLY): STREPTOCOCCUS, GROUP A SCREEN (DIRECT): NEGATIVE

## 2014-04-06 NOTE — Progress Notes (Signed)
Patient ID: [name] [name] MRN: [id number], DOB: 1991-03-05, 23 y.o. Date of Encounter: 04/06/2014, 2:48 PM    Chief Complaint:  Chief Complaint  Patient presents with  . Illness    x3 days- trouble breathing with exertion, vomiting acid reflus, chest congestion, nonproductive cough, muscle pains, headaches, fever, sore throat, nasal congestion     HPI: 23 y.o. year old white male says that over the last few days he has had some cough in the mornings. Says that sometimes he coughs and ends up coughing up some acid reflux. However not really getting out any phlegm. Also says he's had some nasal congestion over the last week but says it when he blows anything out it is just clear rhinorrhea. No thick mucous. He really does not mention to me anything about the headache and a sore throat and muscle pains. When asked him about those symptoms he says that he thinks everythings ok but wanted to make sure he didn't have anything." Also ask about the fever but he says that he has had no documented fever.     Home Meds:   Outpatient Prescriptions Prior to Visit  Medication Sig Dispense Refill  . albuterol (PROVENTIL HFA;VENTOLIN HFA) 108 (90 BASE) MCG/ACT inhaler Inhale 2 puffs into the lungs every 6 (six) hours as needed. Wheezing        . aspirin 325 MG tablet Take 325 mg by mouth every 6 (six) hours as needed. pain       . Aztreonam Lysine (CAYSTON) 75 MG SOLR Inhale 75 mg into the lungs 3 (three) times daily. Pt does every other month      . busPIRone (BUSPAR) 7.5 MG tablet Take 1 tablet (7.5 mg total) by mouth 2 (two) times daily.  60 tablet  3  . calcium gluconate 500 MG tablet Take 1 tablet by mouth 3 (three) times daily.      . cetirizine (ZYRTEC) 10 MG tablet Take 10 mg by mouth at bedtime.        . cholecalciferol (VITAMIN D) 1000 UNITS tablet Take 4,000 Units by mouth daily.      . diphenhydramine-acetaminophen (TYLENOL PM EXTRA STRENGTH) 25-500 MG TABS Take 2 tablets by mouth at  bedtime as needed. sleep       . fluticasone (FLONASE) 50 MCG/ACT nasal spray Place into both nostrils daily.      Marland Kitchen. ibuprofen (ADVIL,MOTRIN) 200 MG tablet Take 800 mg by mouth every 6 (six) hours as needed. pain       . LORazepam (ATIVAN) 0.5 MG tablet Take 1 tablet (0.5 mg total) by mouth at bedtime.  30 tablet  1  . Multiple Vitamins-Minerals (MULTIVITAMINS THER. W/MINERALS) TABS Take 1 tablet by mouth daily.        . naproxen (EC NAPROSYN) 500 MG EC tablet Take 1 tablet (500 mg total) by mouth 2 (two) times daily with a meal.  60 tablet  0  . Pancrelipase, Lip-Prot-Amyl, (CREON PO) Take 4-6 capsules by mouth See admin instructions. Lipase 24,000, protease 76,000, amylase 120,000, 6 capsule with meals , 4 capsules with snacks.      . pantoprazole (PROTONIX) 40 MG tablet Take 40 mg by mouth 2 (two) times daily.        . ursodiol (ACTIGALL) 300 MG capsule Take 300 mg by mouth 2 (two) times daily.        . vitamin C (ASCORBIC ACID) 500 MG tablet Take 500 mg by mouth daily.  No facility-administered medications prior to visit.    Allergies:  Allergies  Allergen Reactions  . Amoxapine And Related   . Ondansetron Hives  . Amoxicillin Hives, Other (See Comments) and Rash    Gas   . Ondansetron Hcl Rash  . Penicillins Hives, Other (See Comments) and Rash    Gas   . Sulfa Antibiotics Rash      Review of Systems: See HPI for pertinent ROS. All other ROS negative.    Physical Exam: Blood pressure 126/70, pulse 72, temperature 98.9 F (37.2 C), temperature source Oral, resp. rate 16, height 5\' 7"  (1.702 m), weight 118 lb (53.524 kg)., Body mass index is 18.48 kg/(m^2). General:  Thin WM. Appears in no distress.  HEENT: Normocephalic, atraumatic, eyes without discharge, sclera non-icteric, nares are without discharge. Bilateral auditory canals clear, TM's are without perforation, pearly grey and translucent with reflective cone of light bilaterally. Oral cavity moist, posterior  pharynx without exudate, erythema, peritonsillar abscess, or post nasal drip.  Neck: Supple. No thyromegaly. No lymphadenopathy. Lungs: Clear bilaterally to auscultation without wheezes, rales, or rhonchi. Breathing is unlabored. Heart: Regular rhythm. No murmurs, rubs, or gallops. Msk:  Strength and tone normal for age. Extremities/Skin: Warm and dry.  No rashes. Neuro: Alert and oriented X 3. Moves all extremities spontaneously. Gait is normal. CNII-XII grossly in tact. Psych:  Responds to questions appropriately with a normal affect.   Results for orders placed in visit on 04/06/14  RAPID STREP SCREEN      Result Value Ref Range   Source THROAT     Streptococcus, Group A Screen (Direct) NEG  NEGATIVE     ASSESSMENT AND PLAN:  23 y.o. year old male with  1. Viral pharyngitis  2. Acute pharyngitis, unspecified pharyngitis type - Rapid Strep Screen  I told him to monitor symptoms and to followup if symptoms worsen or if he does develop fever.  [name] HodgkinsSigned, [name] Beth Sug[place], [place], [facility] 04/06/2014 2:48 PM

## 2014-04-10 ENCOUNTER — Ambulatory Visit (INDEPENDENT_AMBULATORY_CARE_PROVIDER_SITE_OTHER): Payer: Medicaid Other | Admitting: Family Medicine

## 2014-04-10 ENCOUNTER — Other Ambulatory Visit: Payer: Self-pay | Admitting: Family Medicine

## 2014-04-10 ENCOUNTER — Encounter: Payer: Self-pay | Admitting: Family Medicine

## 2014-04-10 VITALS — BP 102/64 | HR 78 | Temp 98.1°F | Resp 16 | Ht 67.0 in | Wt 118.0 lb

## 2014-04-10 DIAGNOSIS — J989 Respiratory disorder, unspecified: Secondary | ICD-10-CM | POA: Insufficient documentation

## 2014-04-10 DIAGNOSIS — J988 Other specified respiratory disorders: Secondary | ICD-10-CM

## 2014-04-10 DIAGNOSIS — F418 Other specified anxiety disorders: Secondary | ICD-10-CM

## 2014-04-10 DIAGNOSIS — G4700 Insomnia, unspecified: Secondary | ICD-10-CM

## 2014-04-10 MED ORDER — LEVOFLOXACIN 500 MG PO TABS: 500.0000 mg | ORAL_TABLET | Freq: Every day | ORAL | Status: DC

## 2014-04-10 NOTE — Telephone Encounter (Signed)
Ok to refill??  Last office visit 04/10/2014.   Last refill 02/15/2014, #1 refill.

## 2014-04-10 NOTE — Patient Instructions (Signed)
Start levaquin once a day  Stop the buspar  F/U 3 months

## 2014-04-10 NOTE — Telephone Encounter (Signed)
Medication called to pharmacy. 

## 2014-04-10 NOTE — Telephone Encounter (Signed)
Okay to refill give 1 extra

## 2014-04-11 NOTE — Assessment & Plan Note (Signed)
It seems every medication that we trialed him his family members have something assay then he comes up with very strange side effects. It boils down to him not wanting to be on the medications because of what his family is staying though he is an adult. We also discussed him going see a therapist or seen a psychiatrist and he initially responded that he does not believe in them. He is still on lorazepam and use to help with his insomnia. Have not received any concerns about overuse of this medication.

## 2014-04-11 NOTE — Assessment & Plan Note (Signed)
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

## 2014-04-11 NOTE — Progress Notes (Signed)
Patient ID: [name] [name], male   DOB: [birth year], 23 y.o.   MRN: [id number]   Subjective:    Patient ID: [name] Groserik S Grassia, male    DOB: [birth year], 23 y.o.   MRN: [id number]  Patient presents for 6 week F/U  patient here to follow-up medications. I'll last visit on Lexapro however he had strange side effects with this therefore he was changed to BuSpar. He states that the past couple weeks he has been waking up with bruises and cuts on his arms and he is not sure where they came from he thinks it is the medication and his family also was against him being on medication they stated they found an old risky jar in his room in today concerned that he drank it while being on the medications. He was to stop taking the medication.  He was seen for recent upper respiratory illness he states that his lungs filled drip he and has an appointment in November which Will he'll medicine he will likely be admitted at this progresses. 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 His cough has mild production at this point he has not had any fever    Review Of Systems:  GEN- denies fatigue, fever, weight loss,weakness, recent illness HEENT- denies eye drainage, change in vision, nasal discharge, CVS- denies chest pain, palpitations RESP- denies SOB, +cough, wheeze ABD- denies N/V, change in stools, abd pain GU- denies dysuria, hematuria, dribbling, incontinence MSK- denies joint pain, muscle aches, injury Neuro- denies headache, dizziness, syncope, seizure activity       Objective:    BP 102/64  Pulse 78  Temp(Src) 98.1 F (36.7 C) (Oral)  Resp 16  Ht 5\' 7"  (1.702 m)  Wt 118 lb (53.524 kg)  BMI 18.48 kg/m2 GEN- NAD, alert and oriented x3 HEENT- PERRL, EOMI, non injected sclera, pink conjunctiva, MMM, oropharynx clear Neck- Supple, no LAD CVS- RRR, no murmur RESP-course Congestion, no wheeze, no specific rales, normal WOB Psych- normal affect and mood, no  SI, well groomed          Assessment & Plan:      Problem List Items Addressed This Visit   Respiratory illness   Insomnia - Primary   Depression with anxiety   Cystic fibrosis      Note: This dictation was prepared with Dragon dictation along with smaller phrase technology. Any transcriptional errors that result from this process are unintentional.

## 2014-05-09 ENCOUNTER — Ambulatory Visit: Payer: Medicaid Other | Admitting: Family Medicine

## 2014-05-17 ENCOUNTER — Encounter: Payer: Self-pay | Admitting: Family Medicine

## 2014-05-17 ENCOUNTER — Ambulatory Visit (INDEPENDENT_AMBULATORY_CARE_PROVIDER_SITE_OTHER): Payer: Medicaid Other | Admitting: Family Medicine

## 2014-05-17 DIAGNOSIS — F418 Other specified anxiety disorders: Secondary | ICD-10-CM

## 2014-05-17 DIAGNOSIS — G4700 Insomnia, unspecified: Secondary | ICD-10-CM

## 2014-05-17 MED ORDER — MIRTAZAPINE 30 MG PO TABS: 30.0000 mg | ORAL_TABLET | Freq: Every day | ORAL | Status: DC

## 2014-05-17 NOTE — Assessment & Plan Note (Signed)
Continue remeron 30mg  at bedtime

## 2014-05-17 NOTE — Assessment & Plan Note (Signed)
Recent admission now back at baseline, meds reviewed

## 2014-05-17 NOTE — Patient Instructions (Signed)
Restart remeron at bedtime F/U 4 month

## 2014-05-17 NOTE — Assessment & Plan Note (Signed)
remeron at bedtime

## 2014-05-17 NOTE — Progress Notes (Signed)
Patient ID: [name] [name] Hand, male   DOB: [birth year], 23 y.o.   MRN: [id number]   Subjective:    Patient ID: [name] [name] Dula, male    DOB: [birth year], 23 y.o.   MRN: [id number]  Patient presents for Hospital F/U  patient here for hospital follow-up. He was admitted secondary to decompensation with his cystic fibrosis. His lungs are much improved now and he states that his pulmonary function test did improve before discharge. Due to poor sleep and appetite he was started on Remeron however he lost the prescription for this he states that it was working well when he was in the hospital. He has no new concerns today. All of his immunizations are up-to-date   Note he did have most of his front [facility]'re planning to replace his teeth in about 4 weeks Review Of Systems:  GEN- denies fatigue, fever, weight loss,weakness, recent illness HEENT- denies eye drainage, change in vision, nasal discharge, CVS- denies chest pain, palpitations RESP- denies SOB, cough, wheeze ABD- denies N/V, change in stools, abd pain GU- denies dysuria, hematuria, dribbling, incontinence MSK- denies joint pain, muscle aches, injury Neuro- denies headache, dizziness, syncope, seizure activity       Objective:    BP 118/64 mmHg  Pulse 78  Temp(Src) 97.6 F (36.4 C) (Oral)  Resp 16  Ht 5\' 7"  (1.702 m)  Wt 128 lb (58.06 kg)  BMI 20.04 kg/m2 GEN- NAD, alert and oriented x3 HEENT- PERRL, EOMI, non injected sclera, pink conjunctiva, MMM, oropharynx clear Neck- Supple, no LAD CVS- RRR, no murmur RESP-CTAB Psych- normal affect and mood         Assessment & Plan:      Problem List Items Addressed This Visit    None      Note: This dictation was prepared with Dragon dictation along with smaller phrase technology. Any transcriptional errors that result from this process are unintentional.

## 2014-05-25 ENCOUNTER — Telehealth: Payer: Self-pay | Admitting: Family Medicine

## 2014-05-25 MED ORDER — CETIRIZINE HCL 10 MG PO TABS: 10.0000 mg | ORAL_TABLET | Freq: Every day | ORAL | Status: DC

## 2014-05-25 MED ORDER — URSODIOL 300 MG PO CAPS: 300.0000 mg | ORAL_CAPSULE | Freq: Two times a day (BID) | ORAL | Status: DC

## 2014-05-25 NOTE — Telephone Encounter (Signed)
Patient is calling to get refills on his cetirizine Actigall and Imitrex he says there are no refills  [phone]

## 2014-05-25 NOTE — Telephone Encounter (Signed)
Actigall and Zyrtec sent to pharmacy.   No Imitrex noted on medication history.   Call placed to patient and patient states that he took meds a long time ago.   Ok to refill?

## 2014-05-26 MED ORDER — SUMATRIPTAN SUCCINATE 100 MG PO TABS: ORAL_TABLET | ORAL | Status: DC

## 2014-10-04 ENCOUNTER — Encounter (HOSPITAL_COMMUNITY): Payer: Self-pay | Admitting: Emergency Medicine

## 2014-10-04 ENCOUNTER — Emergency Department (HOSPITAL_COMMUNITY): Payer: Medicaid Other

## 2014-10-04 ENCOUNTER — Emergency Department (HOSPITAL_COMMUNITY)
Admission: EM | Admit: 2014-10-04 | Discharge: 2014-10-04 | Disposition: A | Discharge status: Home / Self Care | Payer: Medicaid Other | Attending: Emergency Medicine | Admitting: Emergency Medicine

## 2014-10-04 DIAGNOSIS — Z87891 Personal history of nicotine dependence: Secondary | ICD-10-CM | POA: Diagnosis not present

## 2014-10-04 DIAGNOSIS — Z8639 Personal history of other endocrine, nutritional and metabolic disease: Secondary | ICD-10-CM | POA: Diagnosis not present

## 2014-10-04 DIAGNOSIS — Z7982 Long term (current) use of aspirin: Secondary | ICD-10-CM | POA: Insufficient documentation

## 2014-10-04 DIAGNOSIS — Z791 Long term (current) use of non-steroidal anti-inflammatories (NSAID): Secondary | ICD-10-CM | POA: Diagnosis not present

## 2014-10-04 DIAGNOSIS — K219 Gastro-esophageal reflux disease without esophagitis: Secondary | ICD-10-CM | POA: Diagnosis not present

## 2014-10-04 DIAGNOSIS — M79672 Pain in left foot: Secondary | ICD-10-CM

## 2014-10-04 DIAGNOSIS — L52 Erythema nodosum: Secondary | ICD-10-CM

## 2014-10-04 DIAGNOSIS — Z880 Allergy status to penicillin: Secondary | ICD-10-CM | POA: Insufficient documentation

## 2014-10-04 DIAGNOSIS — M25572 Pain in left ankle and joints of left foot: Secondary | ICD-10-CM | POA: Diagnosis not present

## 2014-10-04 DIAGNOSIS — Z79899 Other long term (current) drug therapy: Secondary | ICD-10-CM | POA: Diagnosis not present

## 2014-10-04 MED ORDER — NAPROXEN 500 MG PO TABS: 500.0000 mg | ORAL_TABLET | Freq: Two times a day (BID) | ORAL | Status: DC

## 2014-10-04 NOTE — Discharge Instructions (Signed)
Please read and follow all provided instructions.  Your diagnoses today include:  1. Left foot pain   2. Erythema nodosum     Tests performed today include:  An x-ray of the affected area - does NOT show any broken bones  Vital signs. See below for your results today.   Medications prescribed:   Naproxen - anti-inflammatory pain medication  Do not exceed 500mg  naproxen every 12 hours, take with food  You have been prescribed an anti-inflammatory medication or NSAID. Take with food. Take smallest effective dose for the shortest duration needed for your pain. Stop taking if you experience stomach pain or vomiting.   Take any prescribed medications only as directed.  Home care instructions:   Follow any educational materials contained in this packet  Follow R.I.C.E. Protocol:  R - rest your injury   I  - use ice on injury without applying directly to skin  C - compress injury with bandage or splint  E - elevate the injury as much as possible  Follow-up instructions: Please follow-up with your primary care provider or the provided orthopedic physician (bone specialist) if you continue to have significant pain in 1 week. In this case you may have a more severe injury that requires further care.   Return instructions:   Please return if your toes or feet are numb or tingling, appear gray or blue, or you have severe pain (also elevate the leg and loosen splint or wrap if you were given one)  Please return to the Emergency Department if you experience worsening symptoms.   Please return if you have any other emergent concerns.  Additional Information:  Your vital signs today were: BP 113/82 mmHg   Pulse 125   Temp(Src) 98.4 F (36.9 C)   Resp 20   SpO2 99% If your blood pressure (BP) was elevated above 135/85 this visit, please have this repeated by your doctor within one month. -------------- If prescribed crutches for your injury: use crutches with non-weight bearing  for the first few days. Then, you may walk as the pain allows, or as instructed. Start gradually with weight bearing on the affected side. Once you can walk pain free, then try jogging. When you can run forwards, then you can try moving side-to-side. If you cannot walk without crutches in one week, you need a re-check. --------------

## 2014-10-04 NOTE — ED Notes (Signed)
Pt reports left foot and ankle pain that started yesterday but was minimal to day pain is worse. Denies fall or injury. Pt reports inability to put weight on foot.

## 2014-10-04 NOTE — ED Provider Notes (Signed)
CSN: [id number]     Arrival date & time 10/04/14  1248 History  This chart was scribed for non-physician practitioner, Renne Crigler, working with Purvis Sheffield, MD by Richarda Overlie, ED Scribe. This patient was seen in room WTR8/WTR8 and the patient's care was started at 1:14 PM.   Chief Complaint  Patient presents with  . Foot Pain  . Ankle Pain   The history is provided by the patient. No language interpreter was used.   HPI Comments: [name] [name] is a 24 y.o. male with a history of cystic fibrosis and acid reflux who presents to the Emergency Department complaining of worsening left foot pain that started yesterday. Pt states that he was at his cousin's house yesterday when he started walking home when he began experiencing left medial foot pain. He denies any injuries or falls. Pt states that his pain had worsened when he woke up this morning and states that weight bearing aggravates his pain. He says that he has taken advil, aleve and tylenol PM with no pain relief. Pt states he was recently hospitalized for cystic fibrosis so he has been trying to be more active since he was discharged.   Past Medical History  Diagnosis Date  . Cystic fibrosis   . Acid reflux    Past Surgical History  Procedure Laterality Date  . Right ear surgery      removal of excess skin  . Picc line placements     No family history on file. History  Substance Use Topics  . Smoking status: Former Smoker    Types: Cigarettes    Quit date: 04/12/2011  . Smokeless tobacco: Never Used  . Alcohol Use: Yes     Comment: socially    Review of Systems  Constitutional: Negative for activity change.  Musculoskeletal: Positive for arthralgias. Negative for back pain, joint swelling, gait problem and neck pain.  Skin: Negative for wound.  Neurological: Negative for weakness and numbness.    Allergies  Amoxapine and related; Ondansetron; Amoxicillin; Ondansetron hcl; Penicillins; and Sulfa  antibiotics  Home Medications   Prior to Admission medications   Medication Sig Start Date End Date Taking? Authorizing Provider  albuterol (PROVENTIL HFA;VENTOLIN HFA) 108 (90 BASE) MCG/ACT inhaler Inhale 2 puffs into the lungs every 6 (six) hours as needed. Wheezing      Historical Provider, MD  aspirin 325 MG tablet Take 325 mg by mouth every 6 (six) hours as needed. pain     Historical Provider, MD  Aztreonam Lysine (CAYSTON) 75 MG SOLR Inhale 75 mg into the lungs 3 (three) times daily. Pt does every other month    Historical Provider, MD  calcium gluconate 500 MG tablet Take 1 tablet by mouth 3 (three) times daily.    Historical Provider, MD  cetirizine (ZYRTEC) 10 MG tablet Take 1 tablet (10 mg total) by mouth at bedtime. 05/25/14   Salley Scarlet, MD  cholecalciferol (VITAMIN D) 1000 UNITS tablet Take 4,000 Units by mouth daily.    Historical Provider, MD  diphenhydramine-acetaminophen (TYLENOL PM EXTRA STRENGTH) 25-500 MG TABS Take 2 tablets by mouth at bedtime as needed. sleep     Historical Provider, MD  fluticasone (FLONASE) 50 MCG/ACT nasal spray Place into both nostrils daily.    Historical Provider, MD  ibuprofen (ADVIL,MOTRIN) 200 MG tablet Take 800 mg by mouth every 6 (six) hours as needed. pain     Historical Provider, MD  mirtazapine (REMERON) 30 MG tablet Take 1 tablet (30 mg  total) by mouth at bedtime. 05/17/14   Salley Scarlet, MD  Multiple Vitamins-Minerals (MULTIVITAMINS THER. W/MINERALS) TABS Take 1 tablet by mouth daily.      Historical Provider, MD  naproxen (EC NAPROSYN) 500 MG EC tablet Take 1 tablet (500 mg total) by mouth 2 (two) times daily with a meal. 05/25/13   Salley Scarlet, MD  Pancrelipase, Lip-Prot-Amyl, (CREON PO) Take 4-6 capsules by mouth See admin instructions. Lipase 24,000, protease 76,000, amylase 120,000, 6 capsule with meals , 4 capsules with snacks.    Historical Provider, MD  pantoprazole (PROTONIX) 40 MG tablet Take 40 mg by mouth 2 (two)  times daily.      Historical Provider, MD  SUMAtriptan (IMITREX) 100 MG tablet 1 tab at onset of headache , may repeat in 2 hours 05/26/14   Salley Scarlet, MD  ursodiol (ACTIGALL) 300 MG capsule Take 1 capsule (300 mg total) by mouth 2 (two) times daily. 05/25/14   Salley Scarlet, MD  vitamin C (ASCORBIC ACID) 500 MG tablet Take 500 mg by mouth daily.      Historical Provider, MD   BP 113/82 mmHg  Pulse 125  Temp(Src) 98.4 F (36.9 C)  Resp 20  SpO2 99%   Physical Exam  Constitutional: He is oriented to person, place, and time. He appears well-developed and well-nourished.  HENT:  Head: Normocephalic and atraumatic.  Eyes: Conjunctivae are normal. Right eye exhibits no discharge. Left eye exhibits no discharge.  Neck: Normal range of motion. Neck supple. No tracheal deviation present.  Cardiovascular: Normal rate and normal pulses.   Pulses:      Dorsalis pedis pulses are 2+ on the right side, and 2+ on the left side.  Pulmonary/Chest: Effort normal. No respiratory distress.  Abdominal: He exhibits no distension.  Musculoskeletal: He exhibits tenderness. He exhibits no edema.       Left knee: Normal.       Left ankle: He exhibits normal range of motion and no swelling. No tenderness. No lateral malleolus, no medial malleolus and no proximal fibula tenderness found. Achilles tendon normal.       Left lower leg: Normal.       Left foot: There is tenderness. There is normal range of motion, no bony tenderness and no swelling.       Feet:  Neurological: He is alert and oriented to person, place, and time. No sensory deficit.  Motor, sensation, and vascular distal to the injury is fully intact.   Skin: Skin is warm and dry.  Psychiatric: He has a normal mood and affect. His behavior is normal.  Nursing note and vitals reviewed.  ED Course  Procedures   DIAGNOSTIC STUDIES: Oxygen Saturation is 99% on RA, normal by my interpretation.    COORDINATION OF CARE: 1:18 PM Discussed  treatment plan with pt at bedside and pt agreed to plan. Will order left foot x-ray.   Labs Review Labs Reviewed - No data to display  Imaging Review Dg Foot Complete Left  10/04/2014   CLINICAL DATA:  24 year old male with plantar and calcaneus pain for 1 day with no injury. Initial encounter.  EXAM: LEFT FOOT - COMPLETE 3+ VIEW  COMPARISON:  None.  FINDINGS: Bone mineralization is within normal limits. Calcaneus intact. Joint spaces and alignment are preserved. No acute osseous abnormality identified.  IMPRESSION: No osseous abnormality identified in the left foot.   Electronically Signed   By: Odessa Fleming M.D.   On: 10/04/2014 14:44  EKG Interpretation None       Vital signs reviewed and are as follows: Filed Vitals:   10/04/14 1455  BP: 109/72  Pulse: 114  Temp:   Resp: 18   2:56 PM x-ray negative. Patient informed. Provided with crutches and Ace wrap. Counseled on rice protocol. Continue NSAIDs. Orthopedic follow-up in case not improved in one week.   MDM   Final diagnoses:  Left foot pain   Patient with left foot pain, no discrete injury. X-ray negative. Suspect soft tissue injury. Possibly due to overuse. Lower extremity is neurovascularly intact. RICE protocol, NSAIDs indicated with orthopedic follow-up if not improved.   I personally performed the services described in this documentation, which was scribed in my presence. The recorded information has been reviewed and is accurate.      Renne CriglerJoshua Ravenne Wayment, PA-C 10/04/14 1457  Purvis SheffieldForrest Harrison, MD 10/04/14 [phone]

## 2014-10-04 NOTE — ED Notes (Signed)
Ortho tech made aware of orders

## 2014-10-05 ENCOUNTER — Ambulatory Visit: Payer: Medicaid Other | Admitting: Physician Assistant

## 2014-10-25 ENCOUNTER — Ambulatory Visit (INDEPENDENT_AMBULATORY_CARE_PROVIDER_SITE_OTHER): Payer: Medicaid Other | Admitting: Family Medicine

## 2014-10-25 ENCOUNTER — Encounter: Payer: Self-pay | Admitting: Family Medicine

## 2014-10-25 VITALS — BP 120/64 | HR 100 | Temp 99.1°F | Resp 18 | Ht 67.0 in | Wt 111.0 lb

## 2014-10-25 DIAGNOSIS — J988 Other specified respiratory disorders: Secondary | ICD-10-CM | POA: Diagnosis not present

## 2014-10-25 DIAGNOSIS — R042 Hemoptysis: Secondary | ICD-10-CM | POA: Diagnosis not present

## 2014-10-25 MED ORDER — GUAIFENESIN-CODEINE 100-10 MG/5ML PO SOLN: 5.0000 mL | Freq: Four times a day (QID) | ORAL | Status: DC | PRN

## 2014-10-25 MED ORDER — LEVOFLOXACIN 500 MG PO TABS: 500.0000 mg | ORAL_TABLET | Freq: Every day | ORAL | Status: DC

## 2014-10-25 NOTE — Progress Notes (Signed)
Patient ID: [name] [name], male   DOB: [birth year], 24 y.o.   MRN: [id number]   Subjective:    Patient ID: [name] [name], male    DOB: [birth year], 24 y.o.   MRN: [id number]  Patient presents for Chest Pain/ Cough  Pt here with his cousin, [facility] with CF flares also with substantial weight loss. He states he was about this weight when he was admitted on discharge was 130bs, was in the hospital fo r3 weeks. He was exposed to a sick relative and for past 2 weeks he has had cough with green production, low grade fever, higher when sickness initially started. He has has soreness and chest pain which is not typical of his CF flares, last night he had frank hemoptysis and he became concerned . I reviewed [facility], CXR show mucous plugging and scarring but no infiltrate,seems he was treated with azithromycin initially. Due to his illness he has had decreased appetititee and has lost 10 pounds in past 2 weeks. Denies N/V diarrhea    Review Of Systems:  GEN- denies fatigue+, fever, weight loss,weakness, recent illness HEENT- denies eye drainage, change in vision, nasal discharge, CVS-  chest pain, palpitations RESP- + SOB,+ cough, wheeze ABD- denies N/V, change in stools, abd pain GU- denies dysuria, hematuria, dribbling, incontinence MSK- denies joint pain, muscle aches, injury Neuro- denies headache, dizziness, syncope, seizure activity       Objective:    BP 120/64 mmHg  Pulse 100  Temp(Src) 99.1 F (37.3 C) (Oral)  Resp 18  Ht 5\' 7"  (1.702 m)  Wt 111 lb (50.349 kg)  BMI 17.38 kg/m2  SpO2 95% GEN- NAD, alert and oriented x3, no distress, thin appearing HEENT- PERRL, EOMI, non injected sclera, pink conjunctiva, MMM, oropharynx clear Neck- Supple, no LAD CVS- RRR, no murmur RESPdecreased at bases, no wheeze, rhonchi bilat , normal WOB ABD-NABS,soft,NT,ND EXT- No edema Pulses- Radial,- 2+        Assessment & Plan:      Problem  List Items Addressed This Visit    Cystic fibrosis - Primary   Relevant Orders   CT Chest W Contrast   CBC with Differential    Other Visit Diagnoses    Hemoptysis        Relevant Orders    CT Chest W Contrast    Respiratory infection        Concern for HCAP, normal sats, worsening symptoms, obtain CT of chest I think he has too much scarring to determine acute infiltrate and now with hemoptysis, given robitussin AC and Levaquin. Attempted to get CBC w/diff but difficult to get blood from patient       Note: This dictation was prepared with Dragon dictation along with smaller phrase technology. Any transcriptional errors that result from this process are unintentional.

## 2014-10-25 NOTE — Patient Instructions (Addendum)
Start antibiotics CT of chest to be done Take Cough medicine as prescribed [facility]  Change Emergency Contacts

## 2014-10-26 ENCOUNTER — Telehealth: Payer: Self-pay | Admitting: *Deleted

## 2014-10-26 LAB — CBC WITH DIFFERENTIAL/PLATELET
Basophils Absolute: 0 10*3/uL (ref 0.0–0.1)
Basophils Relative: 0 % (ref 0–1)
EOS PCT: 0 % (ref 0–5)
Eosinophils Absolute: 0 10*3/uL (ref 0.0–0.7)
HEMATOCRIT: 39.7 % (ref 39.0–52.0)
HEMOGLOBIN: 13.1 g/dL (ref 13.0–17.0)
LYMPHS ABS: 1.8 10*3/uL (ref 0.7–4.0)
Lymphocytes Relative: 9 % — ABNORMAL LOW (ref 12–46)
MCH: 27 pg (ref 26.0–34.0)
MCHC: 33 g/dL (ref 30.0–36.0)
MCV: 81.9 fL (ref 78.0–100.0)
MONOS PCT: 7 % (ref 3–12)
MPV: 11.7 fL (ref 8.6–12.4)
Monocytes Absolute: 1.4 10*3/uL — ABNORMAL HIGH (ref 0.1–1.0)
Neutro Abs: 17.1 10*3/uL — ABNORMAL HIGH (ref 1.7–7.7)
Neutrophils Relative %: 84 % — ABNORMAL HIGH (ref 43–77)
Platelets: 709 10*3/uL — ABNORMAL HIGH (ref 150–400)
RBC: 4.85 MIL/uL (ref 4.22–5.81)
RDW: 16.4 % — ABNORMAL HIGH (ref 11.5–15.5)
WBC: 20.4 10*3/uL — AB (ref 4.0–10.5)

## 2014-10-26 NOTE — Telephone Encounter (Signed)
Received chest CT results.   Results show CF changes, beginning stages of pneumonia and small kidney stone in L kidney that is causing no problems at this time.   MD made aware and new orders obtained to extend ABTx. Levaquin 500mg  PO QD x10 days. F/U in ER for further hemoptysis. [facility]   Call placed to pharmacy. Extra (3) tabs of Levaquin called in. Advised that d/t insurance, patient will need to complete (7) tabs before he can pick up new prescription.   Call placed to patient. LMTRC.

## 2014-10-26 NOTE — Telephone Encounter (Signed)
Patient returned call and made aware.   CT results faxed to [facility] Idelle LeechJudith Kuh[facility] [phone].

## 2014-10-30 ENCOUNTER — Ambulatory Visit (INDEPENDENT_AMBULATORY_CARE_PROVIDER_SITE_OTHER): Payer: Medicaid Other | Admitting: Family Medicine

## 2014-10-30 ENCOUNTER — Encounter: Payer: Self-pay | Admitting: Family Medicine

## 2014-10-30 DIAGNOSIS — J189 Pneumonia, unspecified organism: Secondary | ICD-10-CM

## 2014-10-30 DIAGNOSIS — E46 Unspecified protein-calorie malnutrition: Secondary | ICD-10-CM | POA: Insufficient documentation

## 2014-10-30 NOTE — Assessment & Plan Note (Signed)
Encourage intake, he has chronic malnutrition in setting of his CF [facility]

## 2014-10-30 NOTE — Progress Notes (Signed)
Patient ID: [name] [name], male   DOB: 1991/05/01, 24 y.o.   MRN: [id number]   Subjective:    Patient ID: [name] Groserik S Caselli, male    DOB: 1991/05/01, 24 y.o.   MRN: [id number]  Patient presents for follow up visit  patient here to follow-up pneumonia. He was seen last week at that time he was having productive cough with blood-tinged sputum he felt very bad his appetite had decreased hearing loss weight. I placed him on Levaquin 500 milligrams for a total of 10 days he is currently on day 6. CT scan showed bilateral infiltrates as well as changes from his cystic fibrosis with bronchiectasis and plugging. His white blood cell count was elevated at 20 which is up from his baseline when he left the hospital back in mid April. He states that he feels much better today he is not having as much chest discomfort cough medicine is helping to decrease this. He has not had any fever no further blood-tinged sputum his appetite has increased.    Review Of Systems:  GEN- denies fatigue, fever, weight loss,weakness, recent illness HEENT- denies eye drainage, change in vision, nasal discharge, CVS- denies chest pain, palpitations RESP- denies SOB, cough, wheeze ABD- denies N/V, change in stools, abd pain GU- denies dysuria, hematuria, dribbling, incontinence MSK- denies joint pain, muscle aches, injury Neuro- denies headache, dizziness, syncope, seizure activity       Objective:    BP 90/64 mmHg  Pulse 80  Temp(Src) 99 F (37.2 C) (Oral)  Resp 18  Wt 122 lb (55.339 kg) GEN- NAD, alert and oriented x3, non toxic appearing HEENT- PERRL, EOMI, non injected sclera, pink conjunctiva, MMM, oropharynx clear Neck- Supple, no LAD CVS- RRR, no murmur RESP-scattered rhonchi bilat, no wheeze, improved air movement to bases ABD-NABS,soft,NT,ND EXT- No edema Pulses- Radial, 2+        Assessment & Plan:      Problem List Items Addressed This Visit    Cystic fibrosis - Primary    Other Visit Diagnoses     CAP (community acquired pneumonia)        Improved symptoms, normal oxygen sats, exam also improved. HE has gained some weight as well. Complete 10 day treatment of levaquin, repeat WBC and metabolic today, [facility], CT report and notes faxed    Relevant Orders    CBC with Differential/Platelet    Comprehensive metabolic panel       Note: This dictation was prepared with Dragon dictation along with smaller phrase technology. Any transcriptional errors that result from this process are unintentional.

## 2014-10-30 NOTE — Patient Instructions (Signed)
Continue current medication Complete the Levaquin  We will fax the lab results F/U as needed or in 3 months

## 2014-10-31 LAB — CBC WITH DIFFERENTIAL/PLATELET
Basophils Absolute: 0 10*3/uL (ref 0.0–0.1)
Basophils Relative: 0 % (ref 0–1)
EOS ABS: 0.3 10*3/uL (ref 0.0–0.7)
Eosinophils Relative: 2 % (ref 0–5)
HEMATOCRIT: 33.4 % — AB (ref 39.0–52.0)
Hemoglobin: 10.8 g/dL — ABNORMAL LOW (ref 13.0–17.0)
Lymphocytes Relative: 10 % — ABNORMAL LOW (ref 12–46)
Lymphs Abs: 1.4 10*3/uL (ref 0.7–4.0)
MCH: 27.1 pg (ref 26.0–34.0)
MCHC: 32.3 g/dL (ref 30.0–36.0)
MCV: 83.9 fL (ref 78.0–100.0)
MONO ABS: 1 10*3/uL (ref 0.1–1.0)
MONOS PCT: 7 % (ref 3–12)
MPV: 11.3 fL (ref 8.6–12.4)
Neutro Abs: 11.3 10*3/uL — ABNORMAL HIGH (ref 1.7–7.7)
Neutrophils Relative %: 81 % — ABNORMAL HIGH (ref 43–77)
Platelets: 626 10*3/uL — ABNORMAL HIGH (ref 150–400)
RBC: 3.98 MIL/uL — ABNORMAL LOW (ref 4.22–5.81)
RDW: 15.8 % — AB (ref 11.5–15.5)
WBC: 14 10*3/uL — ABNORMAL HIGH (ref 4.0–10.5)

## 2014-10-31 LAB — COMPREHENSIVE METABOLIC PANEL
ALT: 10 U/L (ref 0–53)
AST: 18 U/L (ref 0–37)
Albumin: 3.2 g/dL — ABNORMAL LOW (ref 3.5–5.2)
Alkaline Phosphatase: 124 U/L — ABNORMAL HIGH (ref 39–117)
BILIRUBIN TOTAL: 0.3 mg/dL (ref 0.2–1.2)
BUN: 8 mg/dL (ref 6–23)
CHLORIDE: 96 meq/L (ref 96–112)
CO2: 23 mEq/L (ref 19–32)
CREATININE: 0.97 mg/dL (ref 0.50–1.35)
Calcium: 8.4 mg/dL (ref 8.4–10.5)
GLUCOSE: 132 mg/dL — AB (ref 70–99)
POTASSIUM: 4.3 meq/L (ref 3.5–5.3)
SODIUM: 130 meq/L — AB (ref 135–145)
Total Protein: 7.4 g/dL (ref 6.0–8.3)

## 2014-11-15 ENCOUNTER — Other Ambulatory Visit: Payer: Self-pay | Admitting: Family Medicine

## 2014-11-20 ENCOUNTER — Other Ambulatory Visit: Payer: Self-pay | Admitting: *Deleted

## 2014-11-20 MED ORDER — URSODIOL 300 MG PO CAPS: 300.0000 mg | ORAL_CAPSULE | Freq: Two times a day (BID) | ORAL | Status: DC

## 2014-11-20 NOTE — Telephone Encounter (Signed)
Received fax requesting refill on Actigall.   Refill appropriate and filled per protocol.

## 2014-11-21 ENCOUNTER — Telehealth: Payer: Self-pay | Admitting: Family Medicine

## 2014-11-21 MED ORDER — PANTOPRAZOLE SODIUM 40 MG PO TBEC: 40.0000 mg | DELAYED_RELEASE_TABLET | Freq: Two times a day (BID) | ORAL | Status: DC

## 2014-11-21 NOTE — Telephone Encounter (Signed)
Patient calling to get rx sent for his protinix, said he contacted pharmacy but has not heard anything else  [facility] [address]

## 2014-11-21 NOTE — Telephone Encounter (Signed)
Prescription sent to pharmacy.

## 2014-11-24 ENCOUNTER — Encounter: Payer: Self-pay | Admitting: Family Medicine

## 2015-01-13 ENCOUNTER — Emergency Department (HOSPITAL_COMMUNITY): Payer: Medicaid Other

## 2015-01-13 ENCOUNTER — Emergency Department (HOSPITAL_COMMUNITY)
Admission: EM | Admit: 2015-01-13 | Discharge: 2015-01-13 | Disposition: A | Discharge status: Home / Self Care | Payer: Medicaid Other | Attending: Emergency Medicine | Admitting: Emergency Medicine

## 2015-01-13 ENCOUNTER — Encounter (HOSPITAL_COMMUNITY): Payer: Self-pay | Admitting: Emergency Medicine

## 2015-01-13 DIAGNOSIS — M79604 Pain in right leg: Secondary | ICD-10-CM | POA: Insufficient documentation

## 2015-01-13 DIAGNOSIS — R05 Cough: Secondary | ICD-10-CM | POA: Diagnosis present

## 2015-01-13 DIAGNOSIS — Z79899 Other long term (current) drug therapy: Secondary | ICD-10-CM | POA: Diagnosis not present

## 2015-01-13 DIAGNOSIS — K219 Gastro-esophageal reflux disease without esophagitis: Secondary | ICD-10-CM | POA: Diagnosis not present

## 2015-01-13 DIAGNOSIS — Z87891 Personal history of nicotine dependence: Secondary | ICD-10-CM | POA: Diagnosis not present

## 2015-01-13 DIAGNOSIS — Z880 Allergy status to penicillin: Secondary | ICD-10-CM | POA: Insufficient documentation

## 2015-01-13 DIAGNOSIS — Z7982 Long term (current) use of aspirin: Secondary | ICD-10-CM | POA: Insufficient documentation

## 2015-01-13 DIAGNOSIS — J471 Bronchiectasis with (acute) exacerbation: Secondary | ICD-10-CM | POA: Insufficient documentation

## 2015-01-13 DIAGNOSIS — Z8639 Personal history of other endocrine, nutritional and metabolic disease: Secondary | ICD-10-CM | POA: Diagnosis not present

## 2015-01-13 DIAGNOSIS — M79605 Pain in left leg: Secondary | ICD-10-CM | POA: Diagnosis not present

## 2015-01-13 LAB — CBC WITH DIFFERENTIAL/PLATELET
BASOS PCT: 0 % (ref 0–1)
Basophils Absolute: 0 10*3/uL (ref 0.0–0.1)
Eosinophils Absolute: 0.4 10*3/uL (ref 0.0–0.7)
Eosinophils Relative: 3 % (ref 0–5)
HEMATOCRIT: 39.5 % (ref 39.0–52.0)
HEMOGLOBIN: 12.7 g/dL — AB (ref 13.0–17.0)
LYMPHS PCT: 17 % (ref 12–46)
Lymphs Abs: 1.8 10*3/uL (ref 0.7–4.0)
MCH: 28 pg (ref 26.0–34.0)
MCHC: 32.2 g/dL (ref 30.0–36.0)
MCV: 87.2 fL (ref 78.0–100.0)
MONO ABS: 1 10*3/uL (ref 0.1–1.0)
Monocytes Relative: 9 % (ref 3–12)
Neutro Abs: 7.4 10*3/uL (ref 1.7–7.7)
Neutrophils Relative %: 71 % (ref 43–77)
Platelets: 384 10*3/uL (ref 150–400)
RBC: 4.53 MIL/uL (ref 4.22–5.81)
RDW: 13 % (ref 11.5–15.5)
WBC: 10.5 10*3/uL (ref 4.0–10.5)

## 2015-01-13 LAB — BASIC METABOLIC PANEL
Anion gap: 8 (ref 5–15)
BUN: <5 mg/dL — ABNORMAL LOW (ref 6–20)
CO2: 29 mmol/L (ref 22–32)
Calcium: 8.9 mg/dL (ref 8.9–10.3)
Chloride: 102 mmol/L (ref 101–111)
Creatinine, Ser: 0.81 mg/dL (ref 0.61–1.24)
Glucose, Bld: 118 mg/dL — ABNORMAL HIGH (ref 65–99)
POTASSIUM: 3.1 mmol/L — AB (ref 3.5–5.1)
Sodium: 139 mmol/L (ref 135–145)

## 2015-01-13 MED ORDER — OXYCODONE-ACETAMINOPHEN 5-325 MG PO TABS
1.0000 | ORAL_TABLET | Freq: Once | ORAL | Status: AC
  Administered 2015-01-13: 1 via ORAL
  Filled 2015-01-13: qty 1

## 2015-01-13 MED ORDER — OXYCODONE-ACETAMINOPHEN 5-325 MG PO TABS
1.0000 | ORAL_TABLET | Freq: Once | ORAL | Status: AC
Start: 2015-01-13 — End: 2015-01-13
  Administered 2015-01-13: 1 via ORAL
  Filled 2015-01-13: qty 1

## 2015-01-13 MED ORDER — IPRATROPIUM-ALBUTEROL 0.5-2.5 (3) MG/3ML IN SOLN
3.0000 mL | Freq: Once | RESPIRATORY_TRACT | Status: AC
  Administered 2015-01-13: 3 mL via RESPIRATORY_TRACT
  Filled 2015-01-13: qty 3

## 2015-01-13 MED ORDER — PROMETHAZINE HCL 25 MG PO TABS
25.0000 mg | ORAL_TABLET | Freq: Once | ORAL | Status: AC
  Administered 2015-01-13: 25 mg via ORAL
  Filled 2015-01-13: qty 1

## 2015-01-13 MED ORDER — HYDROCODONE-ACETAMINOPHEN 5-325 MG PO TABS: 1.0000 | ORAL_TABLET | Freq: Four times a day (QID) | ORAL | Status: DC | PRN

## 2015-01-13 MED ORDER — SODIUM CHLORIDE 3 % IN NEBU
5.0000 mL | INHALATION_SOLUTION | Freq: Every day | RESPIRATORY_TRACT | Status: DC
  Filled 2015-01-13 (×2): qty 8

## 2015-01-13 MED ORDER — BENZONATATE 100 MG PO CAPS: 100.0000 mg | ORAL_CAPSULE | Freq: Three times a day (TID) | ORAL | Status: DC

## 2015-01-13 NOTE — Discharge Instructions (Signed)
Bronchiectasis Bronchiectasis is a condition in which the airways (bronchi) are damaged and widened. This makes it difficult for the lungs to get rid of mucus. As a result, mucus gathers in the airways, and this often leads to lung infections. Infection can cause inflammation in the airways, which may further weaken and damage the bronchi.  CAUSES  Bronchiectasis may be present at birth (congenital) or may develop later in life. Sometimes there is no apparent cause. Some common causes include:  Cystic fibrosis.   Recurrent lung infections (such as pneumonia, tuberculosis, or fungal infections).  Foreign bodies or other blockages in the lungs.  Breathing in fluid, food, or other foreign objects (aspiration). SIGNS AND SYMPTOMS  Common symptoms include:  A daily cough that brings up mucus and lasts for more than 3 weeks.  Frequent lung infections (such as pneumonia, tuberculosis, or fungal infections).  Shortness of breath and wheezing.   Weakness and fatigue. DIAGNOSIS  Various tests may be done to help diagnose bronchiectasis. Tests may include:  Chest X-rays or CT scans.   Breathing tests to help determine how your lungs are working.   Sputum cultures to check for infection.   Blood tests and other tests to check for related diseases or causes, such as cystic fibrosis. TREATMENT  Treatment varies depending on the severity of the condition. Medicines may be given to loosen the mucus to be coughed up (expectorants), to relax the muscles of the air passages (bronchodilators), or to prevent or treat infections (antibiotics). Physical therapy methods may be recommended to help clear mucus from the lungs. For severe cases, surgery may be done to remove the affected part of the lung. HOME CARE INSTRUCTIONS   Get plenty of rest.   Only take over-the-counter or prescription medicines as directed by your health care provider. If antibiotic medicines were prescribed, take them as  directed. Finish them even if you start to feel better.  Avoid sedatives and antihistamines unless otherwise directed by your health care provider. These medicines tend to thicken the mucus in the lungs.   Perform any breathing exercises or techniques to clear the lungs as directed by your health care provider.  Drink enough fluids to keep your urine clear or pale yellow.  Consider using a cold steam vaporizer or humidifier in your room or home to help loosen secretions.   If the cough is worse at night, try sleeping in a semi-upright position in a recliner or using a couple of pillows.   Avoid cigarette smoke and lung irritants. If you smoke, quit.  Stay inside when pollution and ozone levels are high.   Stay current with vaccinations and immunizations.   Follow up with your health care provider as directed.  SEEK MEDICAL CARE IF:  You cough up more thick, discolored mucus (sputum) that is yellow to green in color.  You have a fever or persistent symptoms for more than 2-3 days.  You cannot control your cough and are losing sleep. SEEK IMMEDIATE MEDICAL CARE IF:   You cough up blood.   You have chest pain or increasing shortness of breath.   You have pain that is getting worse or is uncontrolled with medicines.   You have a fever and your symptoms suddenly get worse. MAKE SURE YOU:  Understand these instructions.   Will watch your condition.   Will get help right away if you are not doing well or get worse.  Document Released: 03/30/2007 Document Revised: 06/07/2013 Document Reviewed: 12/08/2012 Exit[employer] Patient Information   2015 [employer]. This information is not intended to replace advice given to you by your health care provider. Make sure you discuss any questions you have with your health care provider.  

## 2015-01-13 NOTE — Progress Notes (Signed)
5 mL Hypertonic 3% nebulizer solution given. Unable to scan, no barcode. overide per pharmacy.

## 2015-01-13 NOTE — ED Notes (Signed)
Pt requested to speak with MD, Dr. Patria Mane made aware

## 2015-01-13 NOTE — ED Notes (Signed)
Pt. reports persistent dry cough with bilateral lower leg pain onset this evening , respirations unlabored , denies fever or chills.

## 2015-01-13 NOTE — ED Provider Notes (Signed)
CSN: [id number]     Arrival date & time 01/13/15  0125 History   This chart was scribed for [name] Bilis, MD by Abel Presto, ED Scribe. This patient was seen in room B18C/B18C and the patient's care was started at 2:24 AM.    Chief Complaint  Patient presents with  . Cough  . Leg Pain     The history is provided by the patient. No language interpreter was used.   HPI Comments: [name] [name] is a 24 y.o. male with PMHx of cystic fibrosis who presents to the Emergency Department complaining of intermittent dry cough "fits" with onset this evening. Pt reports associated fever at 100.2 during yesterday afternoon which has resolved, post-tussive emesis, bilateral leg pain, SOB, and chest pain.  He states chest pain with onset yesterday feels like cystic fibrosis pain. Pt has tried Tussionex with worsening symptoms and albuterol inhaler with no relief. Pt typical uses 7% hypertonic saline for relief but states he was not home today. Pt denies nausea and diarrhea.   Past Medical History  Diagnosis Date  . Cystic fibrosis   . Acid reflux    Past Surgical History  Procedure Laterality Date  . Right ear surgery      removal of excess skin  . Picc line placements     No family history on file. History  Substance Use Topics  . Smoking status: Former Smoker    Types: Cigarettes    Quit date: 04/12/2011  . Smokeless tobacco: Never Used  . Alcohol Use: Yes    Review of Systems  Respiratory: Positive for cough.   A complete 10 system review of systems was obtained and all systems are negative except as noted in the HPI and PMH.     Allergies  Amoxapine and related; Ondansetron; Amoxicillin; Ondansetron hcl; Penicillins; and Sulfa antibiotics  Home Medications   Prior to Admission medications   Medication Sig Start Date End Date Taking? Authorizing Provider  albuterol (PROVENTIL HFA;VENTOLIN HFA) 108 (90 BASE) MCG/ACT inhaler Inhale 2 puffs into the lungs every 6 (six) hours as  needed. Wheezing      Historical Provider, MD  aspirin 325 MG tablet Take 325 mg by mouth every 6 (six) hours as needed. pain     Historical Provider, MD  Aztreonam Lysine (CAYSTON) 75 MG SOLR Inhale 75 mg into the lungs 3 (three) times daily. Pt does every other month    Historical Provider, MD  calcium gluconate 500 MG tablet Take 1 tablet by mouth 3 (three) times daily.    Historical Provider, MD  cetirizine (ZYRTEC) 10 MG tablet TAKE 1 TABLET BY MOUTH EVERY NIGHT AT BEDTIME 11/15/14   Donita Brooks, MD  cholecalciferol (VITAMIN D) 1000 UNITS tablet Take 4,000 Units by mouth daily.    Historical Provider, MD  diphenhydramine-acetaminophen (TYLENOL PM EXTRA STRENGTH) 25-500 MG TABS Take 2 tablets by mouth at bedtime as needed. sleep     Historical Provider, MD  fluticasone (FLONASE) 50 MCG/ACT nasal spray Place into both nostrils daily.    Historical Provider, MD  guaiFENesin-codeine 100-10 MG/5ML syrup Take 5 mLs by mouth every 6 (six) hours as needed. 10/25/14   Salley Scarlet, MD  levofloxacin (LEVAQUIN) 500 MG tablet Take 1 tablet (500 mg total) by mouth daily. 10/25/14   Salley Scarlet, MD  mirtazapine (REMERON) 30 MG tablet Take 1 tablet (30 mg total) by mouth at bedtime. 05/17/14   Salley Scarlet, MD  Multiple Vitamins-Minerals (MULTIVITAMINS THER.  W/MINERALS) TABS Take 1 tablet by mouth daily.      Historical Provider, MD  naproxen (NAPROSYN) 500 MG tablet Take 1 tablet (500 mg total) by mouth 2 (two) times daily. 10/04/14   Renne Crigler, PA-C  Pancrelipase, Lip-Prot-Amyl, (CREON PO) Take 4-6 capsules by mouth See admin instructions. Lipase 24,000, protease 76,000, amylase 120,000, 6 capsule with meals , 4 capsules with snacks.    Historical Provider, MD  pantoprazole (PROTONIX) 40 MG tablet Take 1 tablet (40 mg total) by mouth 2 (two) times daily. 11/21/14   Salley Scarlet, MD  SUMAtriptan (IMITREX) 100 MG tablet 1 tab at onset of headache , may repeat in 2 hours 05/26/14   Salley Scarlet, MD  ursodiol (ACTIGALL) 300 MG capsule Take 1 capsule (300 mg total) by mouth 2 (two) times daily. 11/20/14   Salley Scarlet, MD  vitamin C (ASCORBIC ACID) 500 MG tablet Take 500 mg by mouth daily.      Historical Provider, MD   BP 121/81 mmHg  Pulse 130  Temp(Src) 98.8 F (37.1 C) (Oral)  Resp 22  Wt 127 lb (57.607 kg)  SpO2 93% Physical Exam  Constitutional: He is oriented to person, place, and time. He appears well-developed and well-nourished.  HENT:  Head: Normocephalic and atraumatic.  Eyes: EOM are normal.  Neck: Normal range of motion.  Cardiovascular: Normal rate, regular rhythm, normal heart sounds and intact distal pulses.   Pulmonary/Chest: Effort normal. No respiratory distress. He has rhonchi in the left upper field.  Abdominal: Soft. He exhibits no distension. There is no tenderness.  Musculoskeletal: Normal range of motion.  Neurological: He is alert and oriented to person, place, and time.  Skin: Skin is warm and dry.  Psychiatric: He has a normal mood and affect. Judgment normal.  Nursing note and vitals reviewed.   ED Course  Procedures (including critical care time) DIAGNOSTIC STUDIES: Oxygen Saturation is 93% on room air, normal by my interpretation.    COORDINATION OF CARE: 2:33 AM Discussed treatment plan with patient at beside, the patient agrees with the plan and has no further questions at this time.   Labs Review Labs Reviewed  CBC WITH DIFFERENTIAL/PLATELET - Abnormal; Notable for the following:    Hemoglobin 12.7 (*)    All other components within normal limits  BASIC METABOLIC PANEL - Abnormal; Notable for the following:    Potassium 3.1 (*)    Glucose, Bld 118 (*)    BUN <5 (*)    All other components within normal limits    Imaging Review Dg Chest 2 View  01/13/2015   CLINICAL DATA:  Cough, chest pain started yesterday. History of cystic fibrosis.  EXAM: CHEST  2 VIEW  COMPARISON:  05/26/2013  FINDINGS: The lungs are  symmetrically inflated. Coarse reticular opacities and bronchial thickening throughout both lungs consistent chronic lung disease. This is progressed from prior exam. Suspect scattered areas of mucous plugging. No confluent airspace disease. Heart size and mediastinal contours are normal. No pleural effusion or pneumothorax. No acute osseous abnormalities are seen.  IMPRESSION: 1. Coarse reticular opacities and bronchial thickening consistent with chronic lung disease, this appears mildly progressed from prior exam of 2014. 2. No superimposed consolidation.   Electronically Signed   By: Rubye Oaks M.D.   On: 01/13/2015 03:24  I personally reviewed the imaging tests through PACS system I reviewed available ER/hospitalization records through the EMR    EKG Interpretation None      MDM  Final diagnoses:  None    Patient is overall well-appearing.  No increased work of breathing.  I suspect this is an exacerbation of his bronchiectasis and cystic fibrosis.  No obvious pneumonia.  Improved after bronchodilators and nebulized hypertonic saline.  Discharge home on a short course of pain medicine and cough suppressants.  He has albuterol and hypertonic saline at home  I personally performed the services described in this documentation, which was scribed in my presence. The recorded information has been reviewed and is accurate.       [name] Bilis, MD 01/13/15 [id number]

## 2015-01-13 NOTE — ED Notes (Signed)
Pt reports nausea, reports allergy to zofran, states uses phenergan at home

## 2015-01-16 ENCOUNTER — Ambulatory Visit (INDEPENDENT_AMBULATORY_CARE_PROVIDER_SITE_OTHER): Payer: Medicaid Other | Admitting: Family Medicine

## 2015-01-16 ENCOUNTER — Encounter: Payer: Self-pay | Admitting: Family Medicine

## 2015-01-16 ENCOUNTER — Ambulatory Visit: Payer: Medicaid Other | Admitting: Family Medicine

## 2015-01-16 DIAGNOSIS — J471 Bronchiectasis with (acute) exacerbation: Secondary | ICD-10-CM

## 2015-01-16 MED ORDER — LEVOFLOXACIN 500 MG PO TABS: 500.0000 mg | ORAL_TABLET | Freq: Every day | ORAL | Status: DC

## 2015-01-16 MED ORDER — GUAIFENESIN-CODEINE 100-10 MG/5ML PO SOLN: 5.0000 mL | Freq: Four times a day (QID) | ORAL | Status: DC | PRN

## 2015-01-16 NOTE — Assessment & Plan Note (Signed)
Treat with Levaquin, continue hypertonic saline, cough medicine given, if he does not improve, he understands he will need admission to [facility]

## 2015-01-16 NOTE — Patient Instructions (Signed)
Take cough medicine  Take antibiotics as prescribed F/U AS NEEDED

## 2015-01-16 NOTE — Progress Notes (Signed)
Patient ID: [name] [name], male   DOB: 1990-09-20, 24 y.o.   MRN: [id number]   Subjective:    Patient ID: [name] [name], male    DOB: August 09, 1990, 24 y.o.   MRN: [id number]  Patient presents for ER F/U and Leg Pain  patient here to follow-up ER visit. He was seen in the ER last Friday after he had 3-4 days of cough with congestion which feels like his typical CF flare. Chest x-ray was done night show any acute infiltrate. He did receive hydrocodone which he took a few tablets of but this has not helped. He also received Tessalon Perles. He is using nebulized hypertonic saline solution as well as his typical nebulizer medications. Has been a lot of stress recently his grandfather passed away this weekend he had been pushing himself hard even though he was having respiratory infection he also had some swelling on his legs from being out but he says this is typical when he gets sick. The swelling and the spots on his legs have gone down. The cough and chest pain does not appear to be improving. He is not had any significant fever that he is aware of. He does have posttussive emesis. He requested a refill on the cough medicine with codeine which she last had filled back in May when he had pneumonia    Review Of Systems:  GEN- + fatigue, fever, weight loss,weakness, recent illness HEENT- denies eye drainage, change in vision, nasal discharge, CVS- denies chest pain, palpitations RESP- denies SOB, +cough, wheeze ABD- denies N/V, change in stools, abd pain GU- denies dysuria, hematuria, dribbling, incontinence MSK- denies joint pain, muscle aches, injury Neuro- denies headache, dizziness, syncope, seizure activity       Objective:    BP 128/62 mmHg  Pulse 114  Temp(Src) 99 F (37.2 C) (Oral)  Resp 26  Ht  (1.702 m)  Wt 121 lb (54.885 kg)  BMI 18.95 kg/m2  SpO2 90% GEN- NAD, alert and oriented x3, ill appearing, sat 90 % HEENT- PERRL, EOMI, non injected sclera, pink conjunctiva, MMM,  oropharynx clear Neck- Supple, no LAD CVS- RRR, no murmur RESP-scattered rhonchi bilat, few scattered  wheeze, decreased air movement bases , normal WOB at rest Skin- intact, no rash noted  EXT- No edema Pulses- Radial, 2+        Assessment & Plan:      Problem List Items Addressed This Visit    Cystic fibrosis - Primary   Acute exacerbation of bronchiectasis    Treat with Levaquin, continue hypertonic saline, cough medicine given, if he does not improve, he understands he will need admission to [facility]         Note: This dictation was prepared with Dragon dictation along with smaller phrase technology. Any transcriptional errors that result from this process are unintentional.

## 2015-01-21 ENCOUNTER — Other Ambulatory Visit: Payer: Self-pay | Admitting: Family Medicine

## 2015-01-22 NOTE — Telephone Encounter (Signed)
Ok to refill??  Last office visit/ refill 01/16/2015.

## 2015-02-05 ENCOUNTER — Ambulatory Visit: Payer: Medicaid Other | Admitting: Family Medicine

## 2015-02-09 LAB — PULMONARY FUNCTION TEST

## 2015-02-14 LAB — PULMONARY FUNCTION TEST

## 2015-03-16 ENCOUNTER — Encounter: Payer: Self-pay | Admitting: Family Medicine

## 2015-03-16 ENCOUNTER — Ambulatory Visit (INDEPENDENT_AMBULATORY_CARE_PROVIDER_SITE_OTHER): Payer: Medicaid Other | Admitting: Family Medicine

## 2015-03-16 ENCOUNTER — Ambulatory Visit
Admission: RE | Admit: 2015-03-16 | Discharge: 2015-03-16 | Disposition: A | Discharge status: Home / Self Care | Payer: Medicaid Other | Source: Ambulatory Visit | Attending: Family Medicine | Admitting: Family Medicine

## 2015-03-16 DIAGNOSIS — G8929 Other chronic pain: Secondary | ICD-10-CM | POA: Diagnosis not present

## 2015-03-16 DIAGNOSIS — M40209 Unspecified kyphosis, site unspecified: Secondary | ICD-10-CM

## 2015-03-16 DIAGNOSIS — G4700 Insomnia, unspecified: Secondary | ICD-10-CM

## 2015-03-16 DIAGNOSIS — M545 Low back pain, unspecified: Secondary | ICD-10-CM

## 2015-03-16 DIAGNOSIS — F418 Other specified anxiety disorders: Secondary | ICD-10-CM | POA: Diagnosis not present

## 2015-03-16 MED ORDER — AZITHROMYCIN 500 MG PO TABS: 500.0000 mg | ORAL_TABLET | Freq: Every day | ORAL | Status: DC

## 2015-03-16 MED ORDER — GUAIFENESIN-CODEINE 100-10 MG/5ML PO SOLN: 5.0000 mL | Freq: Four times a day (QID) | ORAL | Status: DC | PRN

## 2015-03-16 MED ORDER — PROMETHAZINE HCL 12.5 MG PO TABS: 12.5000 mg | ORAL_TABLET | Freq: Three times a day (TID) | ORAL | Status: DC | PRN

## 2015-03-16 MED ORDER — ALBUTEROL SULFATE HFA 108 (90 BASE) MCG/ACT IN AERS: 2.0000 | INHALATION_SPRAY | RESPIRATORY_TRACT | Status: AC | PRN

## 2015-03-16 NOTE — Assessment & Plan Note (Signed)
Multiple meds tried, advised to take zoloft at bedtime I see little to no options for his sleep with avoiding benzo's. Ambien, etc

## 2015-03-16 NOTE — Assessment & Plan Note (Signed)
Complete antibiotics, he has some nausea with antibiotics, phenergan given proair refilled

## 2015-03-16 NOTE — Patient Instructions (Addendum)
Get xray of spine done  Azithromycin as prescribed Try the zoloft  Phenergan for nausea  F/U pending results

## 2015-03-16 NOTE — Progress Notes (Signed)
Patient ID: [name] [name], male   DOB: 26-May-1991, 24 y.o.   MRN: [id number]   Subjective:    Patient ID: [name] [name], male    DOB: 06/04/1991, 24 y.o.   MRN: [id number]  Patient presents for Cough and Back Pain   patient here with his grandmother. He complains of chronic back pain states he has had this since the age of 81 but never had it evaluated. His pain is mostly in the lower back and radiates across the sides but not into the buttocks of the legs. He has not had any injuries that he is aware of.   He states he would tell his doctors at [facility] they would give him tramadol as needed. He has severe cystic fibrosis was recently admitted for about 3 weeks but when he had his follow-up last Monday he was having some sinus drainage they thought that all of his pathogens were not treated there for the put him on both Levaquin and clindamycin. He is also still on azithromycin 3 times a week and he asked for this to be refilled this is his maintenance medication. Has had nausea with both antibiotics    His other complaint is that he continues have problems with his sleep he was started on Zoloft and this was titrated to 75 mg a week ago. We tried him on Remeron and trazodone we are avoiding benzodiazepine's. I also note in his chart that they want to avoid chronic narcotics in him as well. He states he takes melatonin every night as well no improvement in his sleep. The Zoloft makes him very groggy during the day he was not sure if he could take this at nighttime.    Review Of Systems:  GEN- + fatigue, fever, weight loss,weakness, recent illness HEENT- denies eye drainage, change in vision, nasal discharge, CVS- denies chest pain, palpitations RESP- denies SOB, +cough, wheeze ABD- denies N/V, change in stools, abd pain GU- denies dysuria, hematuria, dribbling, incontinence MSK-+ joint pain, muscle aches, injury Neuro- denies headache, dizziness, syncope, seizure activity        Objective:    BP 126/72 mmHg  Pulse 78  Temp(Src) 98.1 F (36.7 C) (Oral)  Resp 16  Ht  (1.702 m)  Wt 124 lb (56.246 kg)  BMI 19.42 kg/m2  SpO2 92% GEN- NAD, alert and oriented x3 HEENT- PERRL, EOMI, non injected sclera, pink conjunctiva, MMM, oropharynx clear Neck- Supple, no thyromegaly CVS- RRR, no murmur RESP- mild rhonchi, no wheeze, good air movement, no wheeze MSK- TTP lumbar spine mild thoracic curvature and kyphosis noted, good ROM, neg SLR Psych- normal affect, tired appearing, not anxious EXT- No edema Pulses- Radial, - 2+        Assessment & Plan:      Problem List Items Addressed This Visit    None    Visit Diagnoses    Chronic low back pain    -  Primary    He has CF which can cause some joint pain and bony demineralization also has some kyphosis, obtain xray thoracic and lumbar spine     Relevant Orders    DG Lumbar Spine Complete    DG Thoracic Spine W/Swimmers    Kyphosis, unspecified kyphosis type, unspecified spinal region           Note: This dictation was prepared with Dragon dictation along with smaller phrase technology. Any transcriptional errors that result from this process are unintentional.

## 2015-03-19 ENCOUNTER — Other Ambulatory Visit: Payer: Self-pay | Admitting: *Deleted

## 2015-03-19 DIAGNOSIS — Z8639 Personal history of other endocrine, nutritional and metabolic disease: Secondary | ICD-10-CM

## 2015-03-19 DIAGNOSIS — G8929 Other chronic pain: Secondary | ICD-10-CM

## 2015-03-19 DIAGNOSIS — Z87898 Personal history of other specified conditions: Secondary | ICD-10-CM

## 2015-03-19 DIAGNOSIS — M549 Dorsalgia, unspecified: Principal | ICD-10-CM

## 2015-04-24 ENCOUNTER — Telehealth: Payer: Self-pay | Admitting: Family Medicine

## 2015-04-24 NOTE — Telephone Encounter (Signed)
Patient needs rx for his cough medicine if possible he is out  [phone]

## 2015-04-24 NOTE — Telephone Encounter (Signed)
What medication

## 2015-05-04 ENCOUNTER — Telehealth: Payer: Self-pay | Admitting: *Deleted

## 2015-05-04 MED ORDER — DEXTROMETHORPHAN-GUAIFENESIN 5-100 MG/5ML PO LIQD: ORAL | Status: DC

## 2015-05-04 NOTE — Telephone Encounter (Signed)
No refills on this, it has narcotic codiene and he has taken too much already He can take OTC Delsym

## 2015-05-04 NOTE — Telephone Encounter (Signed)
Pt grandmother [place] called stating would like to have a refill on guaiFENesin-codeine 100-10 MG/5ML syrup states is still having a bit of a cough.   [facility]  Call back numbers [phone]

## 2015-05-04 NOTE — Telephone Encounter (Signed)
Ok to refill 

## 2015-05-04 NOTE — Telephone Encounter (Signed)
Call placed to patient and patient made aware.  

## 2015-05-25 ENCOUNTER — Telehealth: Payer: Self-pay | Admitting: Family Medicine

## 2015-05-25 NOTE — Telephone Encounter (Signed)
MD please advise

## 2015-05-25 NOTE — Telephone Encounter (Signed)
Patient would like a note to excuse him from jury duty because he coughs so much. [phone]

## 2015-05-25 NOTE — Telephone Encounter (Signed)
Okay to write jury duty note , Due to immunocompromised state with Cystic Fibrosis I recommend he not be on Jury duty. He has chronic cough and has medications that need to be taken throughout the day

## 2015-05-28 ENCOUNTER — Encounter: Payer: Self-pay | Admitting: *Deleted

## 2015-05-28 NOTE — Telephone Encounter (Signed)
Letter completed.   Call placed to patient. LMTRC.

## 2015-05-29 NOTE — Telephone Encounter (Signed)
Call placed to patient. LMTRC.  

## 2015-05-30 NOTE — Telephone Encounter (Signed)
Multiple calls placed to patient with no answer and no return call.   Letter sent.  

## 2015-06-15 LAB — PULMONARY FUNCTION TEST

## 2015-08-29 ENCOUNTER — Other Ambulatory Visit: Payer: Self-pay | Admitting: Family Medicine

## 2015-10-09 ENCOUNTER — Ambulatory Visit (INDEPENDENT_AMBULATORY_CARE_PROVIDER_SITE_OTHER): Payer: Medicaid Other | Admitting: Family Medicine

## 2015-10-09 ENCOUNTER — Encounter: Payer: Self-pay | Admitting: Family Medicine

## 2015-10-09 DIAGNOSIS — J310 Chronic rhinitis: Secondary | ICD-10-CM

## 2015-10-09 DIAGNOSIS — J329 Chronic sinusitis, unspecified: Secondary | ICD-10-CM | POA: Diagnosis not present

## 2015-10-09 DIAGNOSIS — E46 Unspecified protein-calorie malnutrition: Secondary | ICD-10-CM

## 2015-10-09 DIAGNOSIS — J471 Bronchiectasis with (acute) exacerbation: Secondary | ICD-10-CM

## 2015-10-09 MED ORDER — GUAIFENESIN-CODEINE 100-10 MG/5ML PO SOLN: 5.0000 mL | Freq: Four times a day (QID) | ORAL | Status: DC | PRN

## 2015-10-09 MED ORDER — PROMETHAZINE HCL 12.5 MG PO TABS: 12.5000 mg | ORAL_TABLET | Freq: Three times a day (TID) | ORAL | Status: DC | PRN

## 2015-10-09 MED ORDER — LEVOCETIRIZINE DIHYDROCHLORIDE 5 MG PO TABS: 5.0000 mg | ORAL_TABLET | Freq: Every evening | ORAL | Status: DC

## 2015-10-09 MED ORDER — CLINDAMYCIN HCL 300 MG PO CAPS: 300.0000 mg | ORAL_CAPSULE | Freq: Three times a day (TID) | ORAL | Status: DC

## 2015-10-09 NOTE — Progress Notes (Signed)
Patient ID: [name] [name], male   DOB: 18-Apr-1991, 25 y.o.   MRN: [id number]    Subjective:    Patient ID: [name] [name], male    DOB: 06-16-1991, 25 y.o.   MRN: [id number]  Patient presents for Illness Patient here with his typical cystic fibrosis flare. For the past 2 months he's actually had difficulty with his breathing he actually ran fever around 10 2F a couple weeks ago. He states that he started going to the doctor and into the hospital therefore he did not come in but his cough is now worsening. He does have some mild production. He is still using his inhalers as prescribed. His last admission was at the end of December at that time he was treated with tobramycin and ceftaz I believe and then transitioned to clindamycin. He states that recently they noted that his pulse have been resistant to Levaquin. He did have some nausea with the clindamycin but it did help improve his symptoms. He states they were discussing some new medications the patient on to keep his cystic fibrosis on flare and also to help with his malabsorption but nothing has been done. He was actually quite upset from his last hospitalization he felt like he was discharged before he needed to be. He has an appointment to see [facility] pulmonary on May 15. He has lost 6 or 7 pounds since I last saw him in September. He is not drinking any type of supplements but was already advised that he needs to institute these into his diet.  He's also noted some sinus drainage and pressures with the pollen. He has been using his Zyrtec but this does not seem to help him anymore.    Review Of Systems:  GEN- denies fatigue, fever, +weight loss,weakness, recent illness HEENT- denies eye drainage, change in vision, nasal discharge, CVS- denies chest pain, palpitations RESP- +SOB, +cough, wheeze ABD- denies N/V, change in stools, abd pain GU- denies dysuria, hematuria, dribbling, incontinence MSK- denies joint pain, muscle aches,  injury Neuro- denies headache, dizziness, syncope, seizure activity       Objective:    BP 112/60 mmHg  Pulse 100  Temp(Src) 98.8 F (37.1 C) (Oral)  Resp 20  Ht  (1.702 m)  Wt 117 lb (53.071 kg)  BMI 18.32 kg/m2  SpO2 98%  GEN- NAD, alert and oriented x3,weight loss, thin male  HEENT- PERRL, EOMI, non injected sclera, pink conjunctiva, MMM, oropharynx clear , TM clear bilat no effusion,  + mild  maxillary sinus tenderness, inflammed turbinates,  Nasal drainage  Neck- Supple, no LAD CVS- RRR, no murmur RESP-CTAB, good air movement  EXT- No edema Pulses- Radial 2+          Assessment & Plan:      Problem List Items Addressed This Visit    Protein-calorie malnutrition (HCC)   Cystic fibrosis (HCC) - Primary    Acute flare for cystic fibrosis. Him and restart the clindamycin which was the last antibiotic use. I've also given him a one-time prescription for Robitussin with codeine advised him that I would not refill this and he needs to follow with his pulmonologist. His grandmother was also here. He will discuss with him further treatment for his cystic fibrosis. His grandmother wanted to know pulmonary rehabilitation would help him. Advised her to discuss this with his pulmonologist as well. 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 Any transcriptional errors that result from this process are unintentional.

## 2015-10-09 NOTE — Patient Instructions (Signed)
Take antibiotics as prescribed Look into program  Ensure or boost  F/U as needed

## 2015-10-09 NOTE — Assessment & Plan Note (Signed)
Acute flare for cystic fibrosis. Him and restart the clindamycin which was the last antibiotic use. I've also given him a one-time prescription for Robitussin with codeine advised him that I would not refill this and he needs to follow with his pulmonologist. His grandmother was also here. He will discuss with him further treatment for his cystic fibrosis. His grandmother wanted to know pulmonary rehabilitation would help him. Advised her to discuss this with his pulmonologist as well. 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

## 2015-10-11 ENCOUNTER — Telehealth: Payer: Self-pay | Admitting: *Deleted

## 2015-10-11 NOTE — Telephone Encounter (Signed)
Received request from pharmacy for PA on Xyzal.   PA submitted.   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  Received PA determination.   PA apprvoed 10/11/2015- 10/09/2016.  PA # [id number].  Pharmacy made aware.

## 2015-10-14 ENCOUNTER — Other Ambulatory Visit: Payer: Self-pay | Admitting: Family Medicine

## 2015-10-15 NOTE — Telephone Encounter (Signed)
Refill appropriate and filled per protocol. 

## 2015-10-25 IMAGING — CR DG CHEST 2V
2 series · 2 of 2 positions shown · non-contrast
Comparison: September 01, 2011

CLINICAL DATA: Cystic fibrosis; cough and dyspnea

EXAM:
CHEST  2 VIEW

[w chest pa]
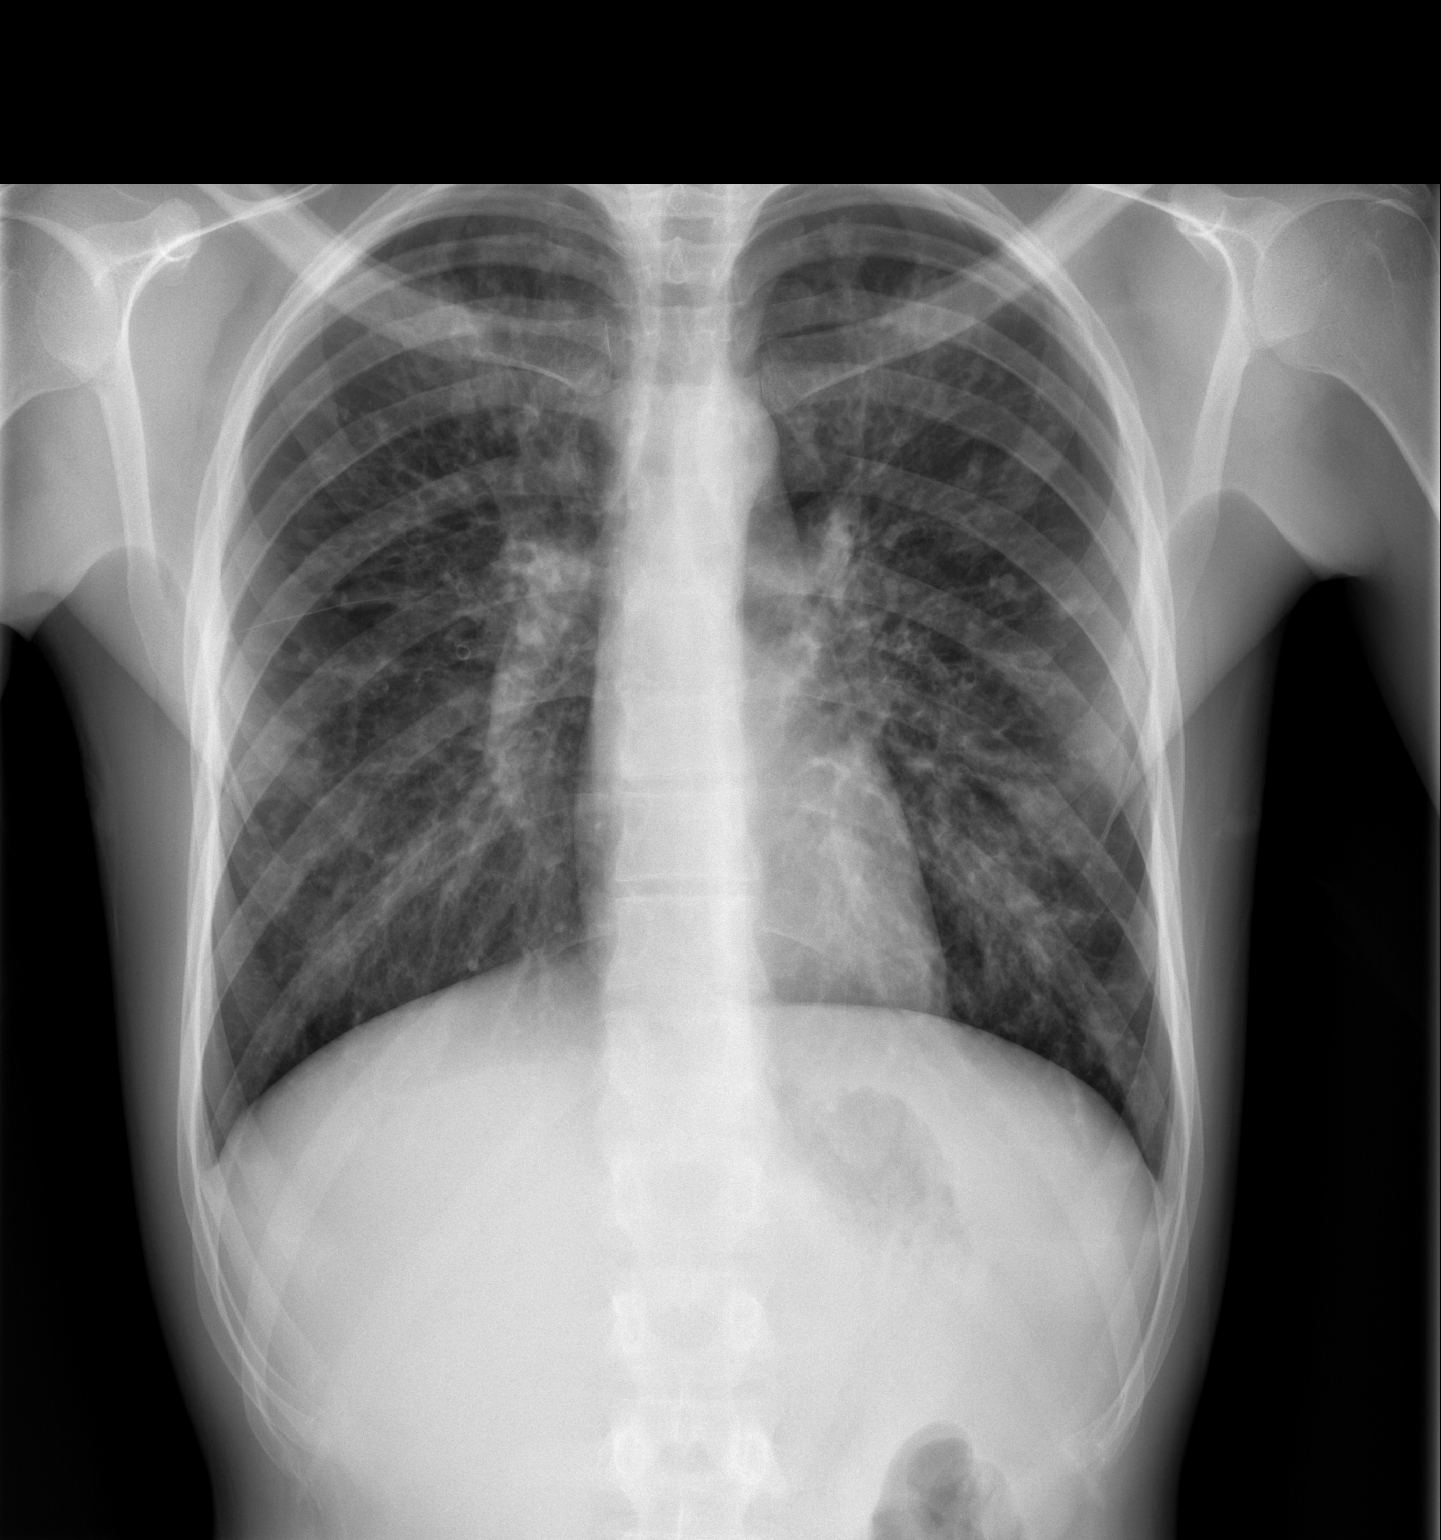

[w chest lat]
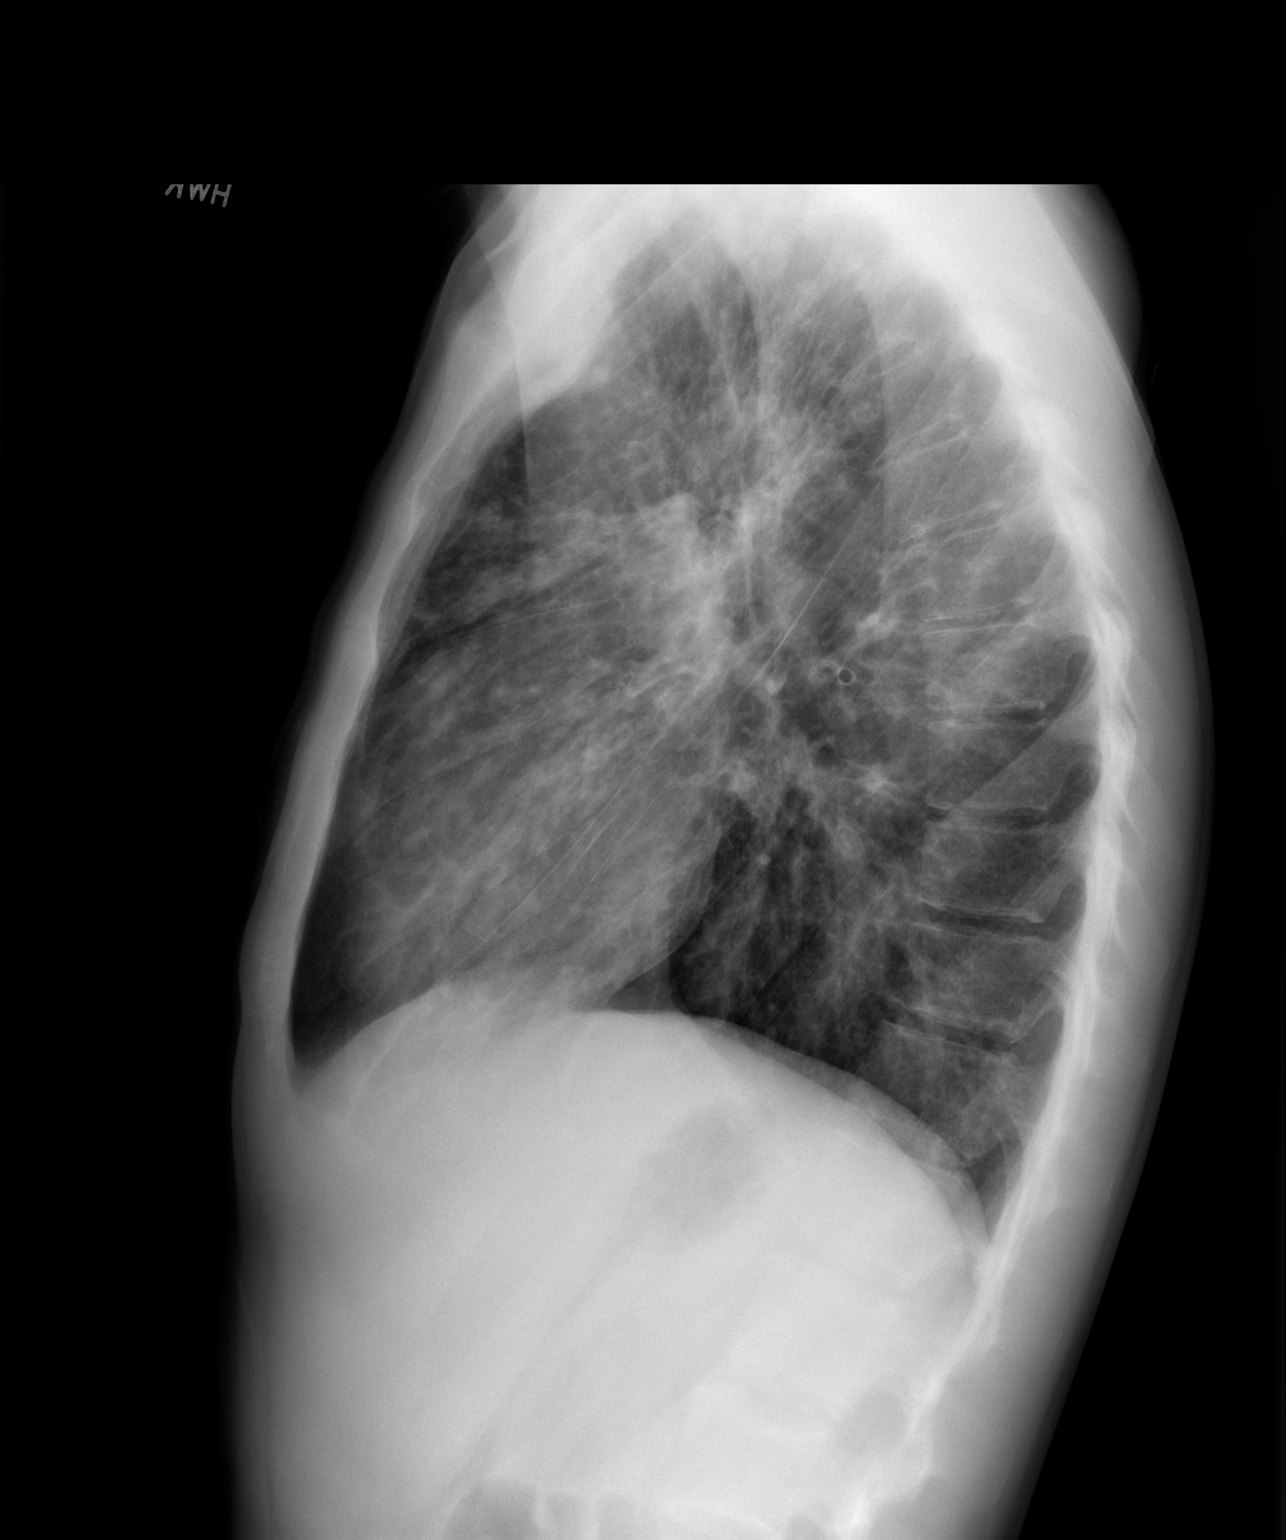

[2 of 2 positions shown; findings below may reference images not displayed]

FINDINGS: Multiple areas of bronchial thickening remain. Several subcentimeter
nodular lesions are noted throughout the upper and mid lung zones,
stable. The interstitium is diffusely prominent but stable. There is
no airspace consolidation. There is no new opacity. The heart size
and pulmonary vascularity are normal. There is no demonstrable
adenopathy. No bone lesions are appreciable.
IMPRESSION: Chronic interstitial prominence with bronchial thickening remains.
Several scattered subcentimeter nodular opacities are present,
probably representing localized areas of peripheral mucus plugging.
There is no consolidation. These changes are consistent with cystic
fibrosis and are stable.

## 2016-01-02 ENCOUNTER — Ambulatory Visit (INDEPENDENT_AMBULATORY_CARE_PROVIDER_SITE_OTHER): Payer: Medicaid Other | Admitting: Family Medicine

## 2016-01-02 ENCOUNTER — Telehealth: Payer: Self-pay | Admitting: Family Medicine

## 2016-01-02 ENCOUNTER — Encounter: Payer: Self-pay | Admitting: Family Medicine

## 2016-01-02 VITALS — BP 100/78 | HR 102 | Temp 99.9°F | Resp 16 | Wt 130.0 lb

## 2016-01-02 DIAGNOSIS — R112 Nausea with vomiting, unspecified: Secondary | ICD-10-CM

## 2016-01-02 MED ORDER — PROMETHAZINE HCL 12.5 MG PO TABS
12.5000 mg | ORAL_TABLET | Freq: Three times a day (TID) | ORAL | Status: DC | PRN
Start: 2016-01-02 — End: 2016-03-14

## 2016-01-02 MED ORDER — PROMETHAZINE HCL 25 MG/ML IJ SOLN
25.0000 mg | Freq: Once | INTRAMUSCULAR | Status: AC
  Administered 2016-01-02: 25 mg via INTRAMUSCULAR

## 2016-01-02 NOTE — Progress Notes (Signed)
Patient ID: [name] [name], male   DOB: [birth year], 25 y.o.   MRN: [id number]    Subjective:    Patient ID: [name] [name], male    DOB: [birth year], 25 y.o.   MRN: [id number]  Patient presents for Extreme nausea  Pt here with nausea, vomiting. Has Cystic Fibrosis with multiple flares of bronchiectasis requiring hospilizations, also has malnutrition.  For the past 4 days he's had severe nausea he's only had emesis once which was yesterday nonbilious nonbloody. He only gets epigastric pain prior to vomiting otherwise does not have any pain no UTI symptoms his breathing has been good. He is actually putting on weight his appetite had been good until recently. He is taking HIS enzymes as described in his medications as prescribed by his specialists. No known sick contacts no diarrhea no constipation He has been keeping fluids down   Review Of Systems:  GEN- denies fatigue, fever, weight loss,weakness, recent illness HEENT- denies eye drainage, change in vision, nasal discharge, CVS- denies chest pain, palpitations RESP- denies SOB, cough, wheeze ABD- denies N/V, change in stools, abd pain GU- denies dysuria, hematuria, dribbling, incontinence MSK- denies joint pain, muscle aches, injury Neuro- denies headache, dizziness, syncope, seizure activity       Objective:    BP 100/78 mmHg  Pulse 102  Temp(Src) 99.9 F (37.7 C) (Oral)  Resp 16  Wt 130 lb (58.968 kg) GEN- NAD, alert and oriented x3, non toxic appearing  HEENT- PERRL, EOMI, non injected sclera, pink conjunctiva, mildy dry MM, oropharynx clear Neck- Supple, no LAD  CVS- RRR, no murmur RESP-CTAB ABD-NABS,soft,NT,ND Pulses- Radial 2+        Assessment & Plan:      Problem List Items Addressed This Visit    Cystic fibrosis (HCC)    Other Visit Diagnoses    Nausea and vomiting, intractability of vomiting not specified, unspecified vomiting type    -  Primary    possible viral illness, gastritis, he was out of his  protonix for a week, pancreatitis with his CF though no pain, no bowel changes. Check labs and lipase, given Phenergan injection as well as oral medication for home. Push the fluids if his symptoms progressed and he needs urgent evaluation in the emergency room. Hopefully this is just a viral case and he gets over this quickly     Relevant Medications    promethazine (PHENERGAN) injection 25 mg (Completed)    Other Relevant Orders    CBC with Differential/Platelet    Comprehensive metabolic panel    Lipase       Note: This dictation was prepared with Dragon dictation along with smaller phrase technology. Any transcriptional errors that result from this process are unintentional.

## 2016-01-02 NOTE — Telephone Encounter (Signed)
Patient throwing up during the weekend, still sick and would like to know if phenergan can be called in to [facility] [address], and patient did make appt for tomorrow   [phone]

## 2016-01-02 NOTE — Telephone Encounter (Signed)
Pt has appt today, will discuss at OV

## 2016-01-02 NOTE — Patient Instructions (Signed)
We will call with labs  Take phenergan Clear fluids, no SODA,  Bland diet for now  F/U pending results

## 2016-01-03 LAB — CBC WITH DIFFERENTIAL/PLATELET
BASOS ABS: 0 {cells}/uL (ref 0–200)
Basophils Relative: 0 %
EOS PCT: 1 %
Eosinophils Absolute: 234 cells/uL (ref 15–500)
HEMATOCRIT: 42 % (ref 38.5–50.0)
HEMOGLOBIN: 13.4 g/dL (ref 13.0–17.0)
LYMPHS ABS: 936 {cells}/uL (ref 850–3900)
Lymphocytes Relative: 4 %
MCH: 28.5 pg (ref 27.0–33.0)
MCHC: 31.9 g/dL — ABNORMAL LOW (ref 32.0–36.0)
MCV: 89.2 fL (ref 80.0–100.0)
MONO ABS: 702 {cells}/uL (ref 200–950)
MPV: 10.9 fL (ref 7.5–12.5)
Monocytes Relative: 3 %
Neutro Abs: 21528 cells/uL — ABNORMAL HIGH (ref 1500–7800)
Neutrophils Relative %: 92 %
Platelets: 372 10*3/uL (ref 140–400)
RBC: 4.71 MIL/uL (ref 4.20–5.80)
RDW: 15.6 % — ABNORMAL HIGH (ref 11.0–15.0)
WBC: 23.4 10*3/uL — ABNORMAL HIGH (ref 3.8–10.8)

## 2016-01-03 LAB — COMPREHENSIVE METABOLIC PANEL
ALBUMIN: 3.6 g/dL (ref 3.6–5.1)
ALK PHOS: 194 U/L — AB (ref 40–115)
ALT: 15 U/L (ref 9–46)
AST: 27 U/L (ref 10–40)
BILIRUBIN TOTAL: 0.2 mg/dL (ref 0.2–1.2)
BUN: 7 mg/dL (ref 7–25)
CO2: 20 mmol/L (ref 20–31)
CREATININE: 0.88 mg/dL (ref 0.60–1.35)
Calcium: 9 mg/dL (ref 8.6–10.3)
Chloride: 100 mmol/L (ref 98–110)
Glucose, Bld: 96 mg/dL (ref 70–99)
Potassium: 4.2 mmol/L (ref 3.5–5.3)
SODIUM: 139 mmol/L (ref 135–146)
TOTAL PROTEIN: 7.6 g/dL (ref 6.1–8.1)

## 2016-01-03 LAB — LIPASE

## 2016-01-04 ENCOUNTER — Encounter: Payer: Self-pay | Admitting: Family Medicine

## 2016-01-04 ENCOUNTER — Ambulatory Visit (INDEPENDENT_AMBULATORY_CARE_PROVIDER_SITE_OTHER): Payer: Medicaid Other | Admitting: Family Medicine

## 2016-01-04 VITALS — BP 98/60 | HR 96 | Temp 99.1°F | Wt 133.0 lb

## 2016-01-04 DIAGNOSIS — D72829 Elevated white blood cell count, unspecified: Secondary | ICD-10-CM | POA: Diagnosis not present

## 2016-01-04 LAB — CBC
HCT: 37.9 % — ABNORMAL LOW (ref 38.5–50.0)
HEMOGLOBIN: 12.3 g/dL — AB (ref 13.0–17.0)
MCH: 29.9 pg (ref 27.0–33.0)
MCHC: 32.5 g/dL (ref 32.0–36.0)
MCV: 92.2 fL (ref 80.0–100.0)
PLATELETS: 272 10*3/uL (ref 140–400)
RBC: 4.11 MIL/uL — AB (ref 4.20–5.80)
RDW: 15.4 % — ABNORMAL HIGH (ref 11.0–15.0)
WBC: 12.6 10*3/uL — ABNORMAL HIGH (ref 3.8–10.8)

## 2016-01-04 MED ORDER — GUAIFENESIN-CODEINE 100-10 MG/5ML PO SOLN: 5.0000 mL | Freq: Four times a day (QID) | ORAL | Status: DC | PRN

## 2016-01-04 MED ORDER — CLINDAMYCIN HCL 300 MG PO CAPS: 300.0000 mg | ORAL_CAPSULE | Freq: Three times a day (TID) | ORAL | Status: DC

## 2016-01-04 NOTE — Progress Notes (Signed)
Called patient to check on him this AM. He stated he isn't having any abdominal pain that may be GI related nor any pain related to his Cystic Fibrous. He said he is any in pain but not related to neither of those. Informed patient that Dr Jeanice Limurham would like for him to come in for labs this afternoon.

## 2016-01-04 NOTE — Progress Notes (Signed)
Patient ID: [name] [name], male   DOB: 1990-12-27, 25 y.o.   MRN: [id number]    Subjective:    Patient ID: [name] [name], male    DOB: 1990-12-27, 25 y.o.   MRN: [id number]  Patient presents for Follow-up  Patient here for follow-up on his labs. He was seen 2 days ago at that time even having nausea with some vomiting for the past few days he did not have any significant abdominal pain no chest discomfort no signs of exacerbation of his cystic fibrosis. His labs including lipase unremarkable the exception of an elevated white blood cell count at 23,000 Yesterday he began feeling achy all over again having cough with some congestion typical for this cystic fibrosis flares. He has not had any fever. The nausea medicine did help. He denies any abdominal pain no change in bowel Review Of Systems:  GEN- + fatigue, fever, weight loss,weakness, recent illness HEENT- denies eye drainage, change in vision, nasal discharge, CVS- denies chest pain, palpitations RESP- denies SOB, +cough, wheeze ABD- denies N/V, change in stools, abd pain GU- denies dysuria, hematuria, dribbling, incontinence MSK- denies joint pain, +muscle aches, injury Neuro- denies headache, dizziness, syncope, seizure activity       Objective:    BP 98/60 mmHg  Pulse 96  Temp(Src) 99.1 F (37.3 C)  Wt 133 lb (60.328 kg)  SpO2 96% GEN- NAD, alert and oriented x3 HEENT- PERRL, EOMI, non injected sclera, pink conjunctiva, MMM, oropharynx clear CVS- RRR, no murmur RESP- good air movement, no wheeze, no rales  ABD-NABS,soft,NT,ND         Assessment & Plan:      Problem List Items Addressed This Visit    Cystic fibrosis (HCC)    Other Visit Diagnoses    Leukocytosis    -  Primary    Possible viral illness, but now starting with symptoms of his CF, his WBC did drop down to 12.6 today. With his history I am going to treat wtih clindamycin x 7days, given cough medicine, continue phenergan as needed Go to ER for any  further problems No sign of pancreatitis     Relevant Orders    CBC (Completed)       Note: This dictation was prepared with Dragon dictation along with smaller phrase technology. Any transcriptional errors that result from this process are unintentional.

## 2016-02-13 ENCOUNTER — Ambulatory Visit (INDEPENDENT_AMBULATORY_CARE_PROVIDER_SITE_OTHER): Payer: Medicaid Other | Admitting: Physician Assistant

## 2016-02-13 ENCOUNTER — Encounter: Payer: Self-pay | Admitting: Physician Assistant

## 2016-02-13 VITALS — BP 118/84 | HR 94 | Temp 98.0°F | Ht 67.0 in | Wt 136.0 lb

## 2016-02-13 DIAGNOSIS — M950 Acquired deformity of nose: Secondary | ICD-10-CM

## 2016-02-14 NOTE — Progress Notes (Signed)
Patient ID: [name] [name] MRN: [id number], DOB: 06-19-1990, 25 y.o. Date of Encounter: 02/14/2016, 9:43 AM    Chief Complaint:  Chief Complaint  Patient presents with  . Nose Problem    x2 weeks,scratches in L nostril, thinks may be a hole in left nostril     HPI: 25 y.o. year old white male here with his grandmother for OV.   Says that just about a week ago he just noticed that something seems to be abnormal about his nose. Says that he even stuck a Q-[name] in the left nostril and could swear that it went over to the right nostril. Says that he has never noticed any problems or abnormalities with his nose until this past week. Says that he has not tried to do a piercing in that area and had not done any thing to the region. No known trauma or injury. Has had no purulent drainage from the nose. No fevers or chills. No other concerns or complaints today.     Home Meds:   Outpatient Medications Prior to Visit  Medication Sig Dispense Refill  . albuterol (PROVENTIL HFA;VENTOLIN HFA) 108 (90 BASE) MCG/ACT inhaler Inhale 2 puffs into the lungs every 4 (four) hours as needed. Wheezing 1 Inhaler 6  . albuterol (PROVENTIL) (2.5 MG/3ML) 0.083% nebulizer solution Take 2.5 mg by nebulization every 6 (six) hours as needed for wheezing or shortness of breath.    Marland Kitchen aspirin 325 MG tablet Take 325 mg by mouth every 6 (six) hours as needed. pain     . Aztreonam Lysine (CAYSTON) 75 MG SOLR Inhale 75 mg into the lungs 3 (three) times daily. Pt does every other month    . budesonide (PULMICORT) 0.5 MG/2ML nebulizer solution Take 0.5 mg by nebulization 2 (two) times daily.    . calcium gluconate 500 MG tablet Take 1 tablet by mouth 3 (three) times daily.    . fluticasone (FLONASE) 50 MCG/ACT nasal spray Place into both nostrils daily.    Marland Kitchen levocetirizine (XYZAL) 5 MG tablet Take 1 tablet (5 mg total) by mouth every evening. 30 tablet 6  . Melatonin 10 MG TABS Take by mouth.    . Multiple  Vitamins-Minerals (MULTIVITAMINS THER. W/MINERALS) TABS Take 1 tablet by mouth daily.      . naproxen (NAPROSYN) 500 MG tablet Take 1 tablet (500 mg total) by mouth 2 (two) times daily. 20 tablet 0  . Pancrelipase, Lip-Prot-Amyl, (CREON PO) Take 4-6 capsules by mouth See admin instructions. Lipase 24,000, protease 76,000, amylase 120,000, 6 capsule with meals , 4 capsules with snacks.    . pantoprazole (PROTONIX) 40 MG tablet TAKE 1 TABLET(40 MG) BY MOUTH TWICE DAILY 60 tablet 6  . promethazine (PHENERGAN) 12.5 MG tablet Take 1 tablet (12.5 mg total) by mouth every 8 (eight) hours as needed for nausea or vomiting. 20 tablet 1  . ursodiol (ACTIGALL) 300 MG capsule TAKE 1 CAPSULE(300 MG) BY MOUTH TWICE DAILY 60 capsule 6  . vitamin C (ASCORBIC ACID) 500 MG tablet Take 500 mg by mouth daily.      Marland Kitchen ADVAIR HFA 115-21 MCG/ACT inhaler Take 2 puffs by mouth 2 (two) times daily.  11  . clindamycin (CLEOCIN) 300 MG capsule Take 1 capsule (300 mg total) by mouth 3 (three) times daily. 21 capsule 0  . guaiFENesin-codeine 100-10 MG/5ML syrup Take 5 mLs by mouth every 6 (six) hours as needed. 200 mL 0  . sertraline (ZOLOFT) 50 MG tablet Take 75 mg  by mouth daily.     No facility-administered medications prior to visit.     Allergies:  Allergies  Allergen Reactions  . Amoxapine And Related   . Ondansetron Hives  . Amoxicillin Hives, Other (See Comments) and Rash    Gas   . Ondansetron Hcl Rash  . Penicillins Hives, Other (See Comments) and Rash    Gas   . Sulfa Antibiotics Rash      Review of Systems: See HPI for pertinent ROS. All other ROS negative.    Physical Exam: Blood pressure 118/84, pulse 94, temperature 98 F (36.7 C), temperature source Oral, height 5\' 7"  (1.702 m), weight 136 lb (61.7 kg), SpO2 93 %., Body mass index is 21.3 kg/m. General: Thin WM.  Appears in no acute distress. HEENT: Normocephalic, atraumatic, eyes without discharge, sclera non-icteric, nares are without  discharge. When I look into the left nares, there appears to be a perforation in the septum so that there is an opening into the right nares.  Also, there is some type of  "extra septum--appearing" / "mass" coming down into the left nares.  I see no sign of abscess or infection but visualization is limited.  Neck: Supple. No thyromegaly. No lymphadenopathy. Lungs: Clear bilaterally to auscultation without wheezes, rales, or rhonchi. Breathing is unlabored. Heart: Regular rhythm. No murmurs, rubs, or gallops. Msk:  Strength and tone normal for age. Extremities/Skin: Warm and dry.  Neuro: Alert and oriented X 3. Moves all extremities spontaneously. Gait is normal. CNII-XII grossly in tact. Psych:  Responds to questions appropriately with a normal affect.     ASSESSMENT AND PLAN:  25 y.o. year old male with  1. Nasal defect I do not see sign of abscess or infection or anything for me to treat here. Will refer to ENT and marked this as urgent. - Ambulatory referral to ENT   Signed, Frazier RichardsMary Beth Dixon, [place]PA, [facility] 02/14/2016 9:43 AM

## 2016-03-14 ENCOUNTER — Telehealth: Payer: Self-pay | Admitting: Family Medicine

## 2016-03-14 MED ORDER — PROMETHAZINE HCL 12.5 MG PO TABS: 12.5000 mg | ORAL_TABLET | Freq: Three times a day (TID) | ORAL | 1 refills | Status: DC | PRN

## 2016-03-14 NOTE — Telephone Encounter (Signed)
Ms. [name] [name] called and states patient is throwing up and would like his phenergan called into [facility] on [address].  CB#[phone]

## 2016-03-14 NOTE — Telephone Encounter (Signed)
Medication called/sent to requested pharmacy  

## 2016-04-14 ENCOUNTER — Other Ambulatory Visit: Payer: Self-pay | Admitting: Family Medicine

## 2016-05-16 ENCOUNTER — Telehealth: Payer: Self-pay | Admitting: *Deleted

## 2016-05-16 NOTE — Telephone Encounter (Signed)
Received PA request for ursodiol (ACTIGALL) 300mg  capsules.   [place] [employer] preferred alternative is ursodiol 250mg  tablet or ursodiol 500mg  tablet.   Please advise.

## 2016-05-17 NOTE — Telephone Encounter (Signed)
Ok to change it to the 250mg  dose.

## 2016-05-19 MED ORDER — URSODIOL 250 MG PO TABS: 250.0000 mg | ORAL_TABLET | Freq: Two times a day (BID) | ORAL | 3 refills | Status: AC

## 2016-05-19 NOTE — Telephone Encounter (Signed)
Prescription sent to pharmacy.   Call placed to patient and patient made aware per VM. 

## 2016-06-14 ENCOUNTER — Other Ambulatory Visit: Payer: Self-pay | Admitting: Family Medicine

## 2016-07-20 ENCOUNTER — Other Ambulatory Visit: Payer: Self-pay | Admitting: Family Medicine

## 2016-09-22 ENCOUNTER — Ambulatory Visit: Payer: Medicaid Other | Admitting: Family Medicine

## 2016-12-13 ENCOUNTER — Other Ambulatory Visit: Payer: Self-pay | Admitting: Family Medicine

## 2017-02-06 ENCOUNTER — Other Ambulatory Visit: Payer: Self-pay | Admitting: Family Medicine

## 2017-03-18 ENCOUNTER — Telehealth: Payer: Self-pay | Admitting: *Deleted

## 2017-03-18 ENCOUNTER — Other Ambulatory Visit: Payer: Self-pay | Admitting: *Deleted

## 2017-03-18 NOTE — Telephone Encounter (Signed)
noted 
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
## 2017-04-02 ENCOUNTER — Other Ambulatory Visit: Payer: Self-pay

## 2017-04-03 LAB — HEPATIC FUNCTION PANEL
AG RATIO: 1.2 (calc) (ref 1.0–2.5)
ALBUMIN MSPROF: 4.2 g/dL (ref 3.6–5.1)
ALT: 28 U/L (ref 9–46)
AST: 28 U/L (ref 10–40)
Alkaline phosphatase (APISO): 177 U/L — ABNORMAL HIGH (ref 40–115)
BILIRUBIN INDIRECT: 0.3 mg/dL (ref 0.2–1.2)
Bilirubin, Direct: 0.1 mg/dL (ref 0.0–0.2)
GLOBULIN: 3.6 g/dL (ref 1.9–3.7)
TOTAL PROTEIN: 7.8 g/dL (ref 6.1–8.1)
Total Bilirubin: 0.4 mg/dL (ref 0.2–1.2)

## 2017-05-26 ENCOUNTER — Other Ambulatory Visit: Payer: Self-pay | Admitting: Family Medicine

## 2017-12-23 DIAGNOSIS — F321 Major depressive disorder, single episode, moderate: Secondary | ICD-10-CM | POA: Diagnosis not present

## 2018-02-11 DIAGNOSIS — F321 Major depressive disorder, single episode, moderate: Secondary | ICD-10-CM | POA: Diagnosis not present

## 2018-04-02 ENCOUNTER — Inpatient Hospital Stay (HOSPITAL_COMMUNITY)
Admission: EM | Admit: 2018-04-02 | Discharge: 2018-04-07 | DRG: 193 | Disposition: A | Discharge status: Home / Self Care | Payer: Medicaid Other | Attending: Family Medicine | Admitting: Family Medicine

## 2018-04-02 ENCOUNTER — Emergency Department (HOSPITAL_COMMUNITY): Payer: Medicaid Other

## 2018-04-02 ENCOUNTER — Other Ambulatory Visit: Payer: Self-pay

## 2018-04-02 ENCOUNTER — Encounter (HOSPITAL_COMMUNITY): Payer: Self-pay | Admitting: *Deleted

## 2018-04-02 DIAGNOSIS — J470 Bronchiectasis with acute lower respiratory infection: Secondary | ICD-10-CM | POA: Diagnosis present

## 2018-04-02 DIAGNOSIS — J189 Pneumonia, unspecified organism: Secondary | ICD-10-CM

## 2018-04-02 DIAGNOSIS — M545 Low back pain: Secondary | ICD-10-CM | POA: Diagnosis present

## 2018-04-02 DIAGNOSIS — R079 Chest pain, unspecified: Secondary | ICD-10-CM | POA: Diagnosis not present

## 2018-04-02 DIAGNOSIS — J984 Other disorders of lung: Secondary | ICD-10-CM | POA: Diagnosis not present

## 2018-04-02 DIAGNOSIS — R040 Epistaxis: Secondary | ICD-10-CM | POA: Diagnosis present

## 2018-04-02 DIAGNOSIS — Z79899 Other long term (current) drug therapy: Secondary | ICD-10-CM

## 2018-04-02 DIAGNOSIS — G8929 Other chronic pain: Secondary | ICD-10-CM | POA: Diagnosis present

## 2018-04-02 DIAGNOSIS — R000 Tachycardia, unspecified: Secondary | ICD-10-CM | POA: Diagnosis not present

## 2018-04-02 DIAGNOSIS — K769 Liver disease, unspecified: Secondary | ICD-10-CM | POA: Diagnosis present

## 2018-04-02 DIAGNOSIS — J181 Lobar pneumonia, unspecified organism: Principal | ICD-10-CM | POA: Diagnosis present

## 2018-04-02 DIAGNOSIS — Z681 Body mass index (BMI) 19 or less, adult: Secondary | ICD-10-CM

## 2018-04-02 DIAGNOSIS — R042 Hemoptysis: Secondary | ICD-10-CM | POA: Diagnosis present

## 2018-04-02 DIAGNOSIS — Z880 Allergy status to penicillin: Secondary | ICD-10-CM

## 2018-04-02 DIAGNOSIS — F419 Anxiety disorder, unspecified: Secondary | ICD-10-CM | POA: Diagnosis present

## 2018-04-02 DIAGNOSIS — Z87891 Personal history of nicotine dependence: Secondary | ICD-10-CM

## 2018-04-02 DIAGNOSIS — Z882 Allergy status to sulfonamides status: Secondary | ICD-10-CM

## 2018-04-02 DIAGNOSIS — E876 Hypokalemia: Secondary | ICD-10-CM | POA: Diagnosis present

## 2018-04-02 DIAGNOSIS — K219 Gastro-esophageal reflux disease without esophagitis: Secondary | ICD-10-CM | POA: Diagnosis present

## 2018-04-02 DIAGNOSIS — Z7951 Long term (current) use of inhaled steroids: Secondary | ICD-10-CM

## 2018-04-02 DIAGNOSIS — R0600 Dyspnea, unspecified: Secondary | ICD-10-CM | POA: Diagnosis not present

## 2018-04-02 DIAGNOSIS — Z7982 Long term (current) use of aspirin: Secondary | ICD-10-CM

## 2018-04-02 DIAGNOSIS — D649 Anemia, unspecified: Secondary | ICD-10-CM | POA: Diagnosis present

## 2018-04-02 DIAGNOSIS — E43 Unspecified severe protein-calorie malnutrition: Secondary | ICD-10-CM | POA: Diagnosis present

## 2018-04-02 DIAGNOSIS — K8689 Other specified diseases of pancreas: Secondary | ICD-10-CM | POA: Diagnosis present

## 2018-04-02 LAB — CBC WITH DIFFERENTIAL/PLATELET
Abs Immature Granulocytes: 0.09 10*3/uL — ABNORMAL HIGH (ref 0.00–0.07)
Basophils Absolute: 0.1 10*3/uL (ref 0.0–0.1)
Basophils Relative: 0 %
Eosinophils Absolute: 0.2 10*3/uL (ref 0.0–0.5)
Eosinophils Relative: 1 %
HCT: 41 % (ref 39.0–52.0)
Hemoglobin: 12.3 g/dL — ABNORMAL LOW (ref 13.0–17.0)
Immature Granulocytes: 0 %
Lymphocytes Relative: 6 %
Lymphs Abs: 1.2 10*3/uL (ref 0.7–4.0)
MCH: 26.8 pg (ref 26.0–34.0)
MCHC: 30 g/dL (ref 30.0–36.0)
MCV: 89.3 fL (ref 80.0–100.0)
Monocytes Absolute: 1.3 10*3/uL — ABNORMAL HIGH (ref 0.1–1.0)
Monocytes Relative: 6 %
Neutro Abs: 17.6 10*3/uL — ABNORMAL HIGH (ref 1.7–7.7)
Neutrophils Relative %: 87 %
Platelets: 520 10*3/uL — ABNORMAL HIGH (ref 150–400)
RBC: 4.59 MIL/uL (ref 4.22–5.81)
RDW: 12.9 % (ref 11.5–15.5)
WBC: 20.4 10*3/uL — ABNORMAL HIGH (ref 4.0–10.5)
nRBC: 0 % (ref 0.0–0.2)

## 2018-04-02 LAB — BASIC METABOLIC PANEL
Anion gap: 11 (ref 5–15)
BUN: 6 mg/dL (ref 6–20)
CO2: 24 mmol/L (ref 22–32)
Calcium: 8.7 mg/dL — ABNORMAL LOW (ref 8.9–10.3)
Chloride: 100 mmol/L (ref 98–111)
Creatinine, Ser: 0.81 mg/dL (ref 0.61–1.24)
GFR calc Af Amer: >60 mL/min (ref >60)
GFR calc non Af Amer: >60 mL/min (ref >60)
Glucose, Bld: 146 mg/dL — ABNORMAL HIGH (ref 70–99)
Potassium: 3.7 mmol/L (ref 3.5–5.1)
Sodium: 135 mmol/L (ref 135–145)

## 2018-04-02 LAB — D-DIMER, QUANTITATIVE (NOT AT ARMC): D-Dimer, Quant: 1.58 ug/mL-FEU — ABNORMAL HIGH (ref 0.00–0.50)

## 2018-04-02 LAB — I-STAT TROPONIN, ED: Troponin i, poc: 0.02 ng/mL (ref 0.00–0.08)

## 2018-04-02 MED ORDER — IOPAMIDOL (ISOVUE-370) INJECTION 76%
100.0000 mL | Freq: Once | INTRAVENOUS | Status: AC | PRN
  Administered 2018-04-02: 100 mL via INTRAVENOUS

## 2018-04-02 MED ORDER — SODIUM CHLORIDE 0.9 % IV SOLN
2.0000 g | Freq: Three times a day (TID) | INTRAVENOUS | Status: DC
  Administered 2018-04-02 – 2018-04-06 (×11): 2 g via INTRAVENOUS
  Filled 2018-04-02 (×12): qty 2

## 2018-04-02 MED ORDER — IOPAMIDOL (ISOVUE-370) INJECTION 76%
INTRAVENOUS | Status: AC
  Filled 2018-04-02: qty 100

## 2018-04-02 MED ORDER — SODIUM CHLORIDE 0.9 % IV BOLUS
1000.0000 mL | Freq: Once | INTRAVENOUS | Status: AC
  Administered 2018-04-02: 1000 mL via INTRAVENOUS

## 2018-04-02 MED ORDER — ACETAMINOPHEN 325 MG PO TABS: 650.0000 mg | ORAL_TABLET | Freq: Four times a day (QID) | ORAL | Status: DC | PRN

## 2018-04-02 MED ORDER — HYDROMORPHONE HCL 1 MG/ML IJ SOLN
0.5000 mg | Freq: Once | INTRAMUSCULAR | Status: AC
  Administered 2018-04-02: 0.5 mg via INTRAVENOUS
  Filled 2018-04-02: qty 1

## 2018-04-02 MED ORDER — NAPROXEN 250 MG PO TABS
500.0000 mg | ORAL_TABLET | ORAL | Status: DC | PRN
  Administered 2018-04-03: 500 mg via ORAL
  Filled 2018-04-02 (×2): qty 2

## 2018-04-02 NOTE — Progress Notes (Signed)
Pharmacy Antibiotic Note [name] [name] is a 27 y.o. male admitted on 04/02/2018 with concern for  pneumonia in setting of cystic fibrosis.  Pharmacy has been consulted for cefepime dosing.  Plan: 1. Cefepime 2 grams IV every 8 hours 2. Following up pending culture data and clinical response and adjust antibiotics as needed    Height: 5\' 9"  (175.3 cm) Weight: 136 lb 0.4 oz (61.7 kg) IBW/kg (Calculated) : 70.7  Temp (24hrs), Avg:99.9 F (37.7 C), Min:99.8 F (37.7 C), Max:99.9 F (37.7 C)  Recent Labs  Lab 04/02/18 1735  WBC 20.4*  CREATININE 0.81    Estimated Creatinine Clearance: 119.5 mL/min (by C-G formula based on SCr of 0.81 mg/dL).    Allergies  Allergen Reactions  . Amoxapine And Related   . Ondansetron Hives  . Amoxicillin Hives, Other (See Comments) and Rash    Gas   . Ondansetron Hcl Rash  . Penicillins Hives, Other (See Comments) and Rash    Gas   . Sulfa Antibiotics Rash    Antimicrobials this admission: 10/18 Cefepime >>   Microbiology results: Pending   Thank you for allowing pharmacy to be a part of this patient's care.  Pollyann Samples, PharmD, BCPS 04/02/2018, 9:33 PM

## 2018-04-02 NOTE — ED Provider Notes (Signed)
[facility] EMERGENCY DEPARTMENT Provider Note   CSN: [id number] Arrival date & time: 04/02/18  1711     History   Chief Complaint Chief Complaint  Patient presents with  . Chest Pain    HPI [name] [name] is a 27 y.o. male.  The history is provided by the patient. No language interpreter was used.  Chest Pain   This is a new problem. The current episode started more than 2 days ago. The problem occurs constantly. The problem has been gradually worsening. The pain is associated with exertion. The pain is moderate. The quality of the pain is described as sharp. The pain does not radiate. Associated symptoms include nausea. He has tried nothing for the symptoms. The treatment provided no relief. Risk factors include male gender. Past medical history comments: Cystic fibrosis    Past Medical History:  Diagnosis Date  . Acid reflux   . Cystic fibrosis     Patient Active Problem List   Diagnosis Date Noted  . Acute exacerbation of bronchiectasis (HCC) 01/16/2015  . Protein-calorie malnutrition (HCC) 10/30/2014  . Respiratory illness 04/10/2014  . Depression with anxiety 01/02/2014  . Insomnia 05/25/2013  . Rib pain on right side 05/25/2013  . Tracheobronchitis 05/16/2011  . Cystic fibrosis (HCC) 05/13/2011    Past Surgical History:  Procedure Laterality Date  . picc line placements    . right ear surgery     removal of excess skin        Home Medications    Prior to Admission medications   Medication Sig Start Date End Date Taking? Authorizing Provider  albuterol (PROVENTIL HFA;VENTOLIN HFA) 108 (90 BASE) MCG/ACT inhaler Inhale 2 puffs into the lungs every 4 (four) hours as needed. Wheezing 03/16/15   Salley Scarlet, MD  albuterol (PROVENTIL) (2.5 MG/3ML) 0.083% nebulizer solution Take 2.5 mg by nebulization every 6 (six) hours as needed for wheezing or shortness of breath.    [provider]  aspirin 325 MG tablet Take 325 mg by  mouth every 6 (six) hours as needed. pain     [provider]  Aztreonam Lysine (CAYSTON) 75 MG SOLR Inhale 75 mg into the lungs 3 (three) times daily. Pt does every other month    [provider]  budesonide (PULMICORT) 0.5 MG/2ML nebulizer solution Take 0.5 mg by nebulization 2 (two) times daily.    [provider]  budesonide-formoterol (SYMBICORT) 160-4.5 MCG/ACT inhaler Inhale 2 puffs into the lungs 2 (two) times daily. 01/14/16 01/13/17  [provider]  calcium gluconate 500 MG tablet Take 1 tablet by mouth 3 (three) times daily.    [provider]  fluticasone (FLONASE) 50 MCG/ACT nasal spray Place into both nostrils daily.    [provider]  levocetirizine (XYZAL) 5 MG tablet TAKE 1 TABLET BY MOUTH EVERY EVENING 07/21/16   Salley Scarlet, MD  Melatonin 10 MG TABS Take by mouth.    [provider]  Multiple Vitamins-Minerals (MULTIVITAMINS THER. W/MINERALS) TABS Take 1 tablet by mouth daily.      [provider]  naproxen (NAPROSYN) 500 MG tablet Take 1 tablet (500 mg total) by mouth 2 (two) times daily. 10/04/14   Renne Crigler, PA-C  Pancrelipase, Lip-Prot-Amyl, (CREON PO) Take 4-6 capsules by mouth See admin instructions. Lipase 24,000, protease 76,000, amylase 120,000, 6 capsule with meals , 4 capsules with snacks.    [provider]  pantoprazole (PROTONIX) 40 MG tablet TAKE ONE TABLET BY MOUTH TWICE  DAILY 05/27/17   Salley Scarlet, MD  promethazine (PHENERGAN) 12.5 MG tablet Take 1 tablet (12.5 mg total) by mouth every 8 (eight) hours as needed for nausea or vomiting. 03/14/16   Donita Brooks, MD  sertraline (ZOLOFT) 100 MG tablet Take 100 mg by mouth daily. 06/18/15   [provider]  ursodiol (ACTIGALL) 250 MG tablet Take 1 tablet (250 mg total) by mouth 2 (two) times daily. 05/19/16   Allayne Butcher B, PA-C  ursodiol (ACTIGALL) 300 MG capsule TAKE 1 CAPSULE(300 MG) BY MOUTH TWICE DAILY 12/15/16    Salley Scarlet, MD  vitamin C (ASCORBIC ACID) 500 MG tablet Take 500 mg by mouth daily.      [provider]    Family History No family history on file.  Social History Social History   Tobacco Use  . Smoking status: Former Smoker    Types: Cigarettes    Last attempt to quit: 04/12/2011    Years since quitting: 6.9  . Smokeless tobacco: Never Used  Substance Use Topics  . Alcohol use: Yes    Alcohol/week: 0.0 standard drinks  . Drug use: Yes    Types: Marijuana     Allergies   Amoxapine and related; Ondansetron; Amoxicillin; Ondansetron hcl; Penicillins; and Sulfa antibiotics   Review of Systems Review of Systems  Cardiovascular: Positive for chest pain.  Gastrointestinal: Positive for nausea.  All other systems reviewed and are negative.    Physical Exam Updated Vital Signs BP 101/68 (BP Location: Right Arm)   Pulse (!) 117   Temp 99.9 F (37.7 C) (Oral)   Resp 15   Ht 5\' 9"  (1.753 m)   Wt 61.7 kg   SpO2 95%   BMI 20.09 kg/m   Physical Exam  Constitutional: He appears well-developed and well-nourished.  HENT:  Head: Normocephalic and atraumatic.  Eyes: Conjunctivae are normal.  Neck: Neck supple.  Cardiovascular: Normal rate, regular rhythm and normal pulses.  No murmur heard. Pulmonary/Chest: Effort normal and breath sounds normal. No respiratory distress. He has no decreased breath sounds.  Abdominal: Soft. There is no tenderness.  Musculoskeletal: He exhibits no edema.  Neurological: He is alert.  Skin: Skin is warm and dry.  Psychiatric: He has a normal mood and affect.  Nursing note and vitals reviewed.    ED Treatments / Results  Labs (all labs ordered are listed, but only abnormal results are displayed) Labs Reviewed  BASIC METABOLIC PANEL - Abnormal; Notable for the following components:      Result Value   Glucose, Bld 146 (*)    Calcium 8.7 (*)    All other components within normal limits  CBC WITH  DIFFERENTIAL/PLATELET - Abnormal; Notable for the following components:   WBC 20.4 (*)    Hemoglobin 12.3 (*)    Platelets 520 (*)    Neutro Abs 17.6 (*)    Monocytes Absolute 1.3 (*)    Abs Immature Granulocytes 0.09 (*)    All other components within normal limits  D-DIMER, QUANTITATIVE (NOT AT [facility]) - Abnormal; Notable for the following components:   D-Dimer, Quant 1.58 (*)    All other components within normal limits  CULTURE, BLOOD (ROUTINE X 2)  CULTURE, BLOOD (ROUTINE X 2)  I-STAT TROPONIN, ED    EKG None  Radiology Dg Chest 2 View  Result Date: 04/02/2018 CLINICAL DATA:  Chest pain, dyspnea. EXAM: CHEST - 2 VIEW COMPARISON:  Radiographs of January 13, 2015. FINDINGS: The heart size and mediastinal  contours are within normal limits. No pneumothorax or pleural effusion is noted. Stable coarse reticular densities are noted throughout both lungs most consistent with chronic scarring. New airspace opacity is noted in right middle lobe consistent with pneumonia. The visualized skeletal structures are unremarkable. IMPRESSION: New right middle lobe airspace opacity is noted consistent with pneumonia. Stable scarring is seen throughout both lungs. Electronically Signed   By: Lupita Raider, M.D.   On: 04/02/2018 18:12   Ct Angio Chest Pe W And/or Wo Contrast  Result Date: 04/02/2018 CLINICAL DATA:  Chest pain, dyspnea. EXAM: CT ANGIOGRAPHY CHEST WITH CONTRAST TECHNIQUE: Multidetector CT imaging of the chest was performed using the standard protocol during bolus administration of intravenous contrast. Multiplanar CT image reconstructions and MIPs were obtained to evaluate the vascular anatomy. CONTRAST:  60 mL ISOVUE-370 IOPAMIDOL (ISOVUE-370) INJECTION 76% COMPARISON:  Radiographs of same day. FINDINGS: Cardiovascular: Satisfactory opacification of the pulmonary arteries to the segmental level. No evidence of pulmonary embolism. Normal heart size. No pericardial effusion. Mediastinum/Nodes:  Thyroid gland and esophagus are unremarkable. 18 mm subcarinal lymph node is noted. 13 mm right hilar lymph node is noted. 14 mm left hilar lymph node is noted. Lungs/Pleura: No pneumothorax or pleural effusion is noted. Large airspace opacity is seen in right middle lobe most consistent with pneumonia. Multiple nodular densities of varying sizes are noted throughout both lungs, but most prominently in both upper lobes. Bronchiectasis is also noted throughout both lungs. Debris or other soft tissue abnormality is noted in the bronchi of the left lower lobe and right lower lobe. These findings are most likely related to history of cystic fibrosis. Upper Abdomen: No acute abnormality. Musculoskeletal: No chest wall abnormality. No acute or significant osseous findings. Review of the MIP images confirms the above findings. IMPRESSION: No definite evidence of pulmonary embolus. Large right middle lobe airspace opacity is noted consistent with pneumonia. Bronchiectasis is noted throughout both lungs with nodular densities of varying sizes throughout both lungs, which most likely represent scarring or chronic inflammation an are related to history of cystic fibrosis. Enlarged mediastinal adenopathy is noted which most likely is inflammatory in etiology. Electronically Signed   By: Lupita Raider, M.D.   On: 04/02/2018 20:40    Procedures Procedures (including critical care time)  Medications Ordered in ED Medications  iopamidol (ISOVUE-370) 76 % injection (has no administration in time range)  sodium chloride 0.9 % bolus 1,000 mL (has no administration in time range)  iopamidol (ISOVUE-370) 76 % injection 100 mL (100 mLs Intravenous Contrast Given 04/02/18 2020)     Initial Impression / Assessment and Plan / ED Course  I have reviewed the triage vital signs and the nursing notes.  Pertinent labs & imaging results that were available during my care of the patient were reviewed by me and considered in my  medical decision making (see chart for details).     MDM  Ct scan shows right middle lobe pneumonia.  Pt's temp 100.5 by me. Tachycardia to 120.  Pt counseled on results.  I discussed antibiotics with Pharmacy.  [facility] notes reviewed.  Pt has had Pseudomonas infection in the past.  I spoke to unassigned medicine family practice who will see for admission.   Final Clinical Impressions(s) / ED Diagnoses   Final diagnoses:  Community acquired pneumonia of right middle lobe of lung (HCC)  Cystic fibrosis [facility])    ED Discharge Orders    None       [place],  Lonia Skinner, PA-C 04/02/18 2154    Benjiman Core, MD 04/03/18 [phone]

## 2018-04-02 NOTE — ED Notes (Signed)
Results reviewed.  No changes in acuity at this time.  Will make sure ordering provider is aware of results.

## 2018-04-02 NOTE — ED Triage Notes (Signed)
The pt has cystic fibrosis and for  2 days.  He has had rt lower chest pain for 2 days  Coughing up some blood when he coughs  Dark in color

## 2018-04-02 NOTE — ED Provider Notes (Signed)
Patient placed in Quick Look pathway, seen and evaluated   Chief Complaint: CP  HPI: Patient with history of cystic fibrosis.  Right lower CP for 3 days. Pleuritic.  No pain at rest.  Notes shortness of breath, hemoptysis, subjective fevers.  No recent travel or surgeries, no leg swelling, no prior history of DVT or PE.  Not on testosterone hormone replacement therapy.  ROS: +CP, SOB, hemoptysis  Physical Exam:   Gen: No distress  Neuro: Awake and Alert  Skin: Warm    Focused Exam: Tachycardic, scattered crackles, somewhat diminished breath sounds.  There is some tenderness to palpation of the right lateral chest wall with no deformity, crepitus, ecchymosis, or flail segment.  Homans sign absent bilaterally.  2+ radial and DP/PT pulses bilaterally.   Initiation of care has begun. The patient has been counseled on the process, plan, and necessity for staying for the completion/evaluation, and the remainder of the medical screening examination    Jeanie Sewer, PA-C 04/02/18 1724    LongArlyss Repress, MD 04/03/18 [phone]

## 2018-04-02 NOTE — H&P (Addendum)
Family Medicine Teaching [facility] Admission History and Physical Service Pager: [phone]  Patient name: [name] [name] Medical record number: [id number] Date of birth: [birth year] Age: 27 y.o. Gender: male  Primary Care Provider: Jeanice Lim, Velna Hatchet, MD Consultants: Nutrition Code Status: Full  Chief Complaint: "I am having chest pain"  Assessment and Plan: [name] [name] is a 27 y.o. male presenting with right sided chest pain and hemoptysis. PMH is significant for Cystic Fibrosis, GERD, and Anxiety.  Right-middle lobe pneumonia  Atypical chest pain: Acute, stable Atypical as chest pain is located on the right that is sharp unrelated to exertion or rest. Initial troponin negative. EKG sinus tachycardia without ST changes making pericarditis or ACS unlikely. CXR significant for new right middle lobe opacity with stable scarring throughout. In the ED, D-dimer was found to be 1.58. CTA chest showed no signs of PE however was consistent with CXR with large right middle lobe opacity consistent with pneumonia and bronchiectasis throughout both lungs consistent with scarring. WBC 20.4 with neutrophil predominance. Blood culture and sputum culture pending. Suspect pneumonia is secondary to cystic fibrosis but no history of recent illness. Pharmacy consulted and Cefepime started in ED to cover for Psuedomonas. Per pharmacy, will add Vancomycin for broad spectrum coverage as patient has history of been covered with Ceftaz and tobraycin during his last pneumonia back in [place] of 2017 which was due to pseudomonas. - Admit to observation, med-surg, attending Dr. Jennette Kettle - Continue IV Cefepime and begin IV Vancomycin will narrow based on pending blood and sputum culture with stain - Dulera BID, albuterol neb q2hr PRN - Pending Strep pneumo and Legionella Ag - AM CBC with diff, CMP - AM repeat EKG - Incentive spirometry - Continuous pulse ox with O2 as needed - Regular diet - Protonix 40mg  BID -  Tylenol and Naproxen PRN - SCD's given recent epistaxis  Cystic Fibrosis  Chronic liver disease  Pancreatic insufficieny: chronic, stable On home Creon TID, Albuterol, MTVI, Pulmicort  neb, Symbicort inhaler, Azithromycin 500mg  (MWF), ursodiol, Levocetirizine, Tezacaftor-Ivacaftor. Followed by [facility] with last appointment in November of 2018. Has appt scheduled 04/08/2018. - Continue multivitamins and enzyme replacement therapy - Holding home azithromycin - Tezacaftor-Ivacaftor not available per pharmacy, patient will bring medication from home - Checking A1c  Hemoptysis  Epistaxis  Anemia: chronic Hemoglobin 12.3. Baseline 12. Due to history of hemoptysis, will continue to monitor closely. Epistaxis possibly source of hemoptysis. - AM CBC - Transfuse <7  Chronic low back pain: stable, mild Denies trauma. Associated with chronic cough and posture. Treats with Naproxen 500mg .  - K-pad - Tylenol and Naproxen PRN  Moderate Protein-calorie malnutrition: chronic Diagnosed in 2016. BNP WNL on admission. Patient drinks Boost with meals TID.  - Regular diet  - Continue Boost supplementation TID with meals - Continue home MTVI - Consult nutrition  Anxiety: chronic, stable Has taken zoloft in the past. No longer taking. Denies any current anxiety. - continue to monitor  GERD: chronic, stable - Continue home Protonix 40mg  BID  FEN/GI: Regular diet, Replace electrolytes PRN, Protonix Prophylaxis: SCDs  Disposition: Home pending improvement based on PNA based on CT and transition to oral antibiotics  History of Present Illness:  [name] [name] is a 27 y.o. male presenting with new onset right sided chest pain and hemoptysis.   Patient has history of cystic fibrosis with a chronic productive cough of green tinged sputum and "dull and achy" diffuse chest pain from his cough. However, patient notes 4 days ago  he had an isolated episode of dark colored hemoptysis without evidence of clots.  The following day the patient notes he continued to cough up dark tinged sputum as well as  developed some pleuritic right-sided chest pain prompting his visit. The chest pain worsened over the next few days. Today, it became a constant and sharp and is exacerbated with any form of movement. Patient notes some night sweats since symptoms began and some nausea, but denies any vomiting, constipation, diarrhea, abdominal pain, SOB, epistaxis, or rhinorrhea. He denies any recent illness or sick contacts. Patient notes he does not get sick often and has not had pneumonia in "a long time". He has not been hospitalized in over a year. Patient notes he is adherent to his daily medications without missing any doses. He is treated for his CF at [facility] in [place] and has an appointment with his new pulmonologist on 04/08/18. 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 Denies any illicit drugs or alcohol. History of smoking 1ppd 10 years ago.  Patient was brought to the [facility] ED by his fiance. He was noted to be tachycardic in 110's and febrile to 100.5, otherwise he was hemodynamically stable on RA. Troponin neg x 1 and EKG significant for sinus tachy without ST changes. CXR significant for new right middle lobe pneumonia and stable scarring throughout. Due to symptoms, D-dimer was ordered and found to be elevated at 1.58. CTA chest was found to be consistent with CXR with large right middle lobe opacity and diffuse bronchiectasis consistent with scarring from CF. WBC elevated at 20.4 with neutrophil predominance. Hemoglobin of 12.3. BMP WNL. Blood culture drawn. Per pharmacy, IV cefepime was initiated. Patient was admitted for treatment of his right middle lobe pneumonia.   Review Of Systems: Per HPI with the following additions:  Otherwise the remainder of the systems were negative.  Review of Systems  Constitutional: Positive for fever.  Negative for chills, malaise/fatigue and weight loss.  HENT: Positive for nosebleeds. Negative for congestion and sore throat.   Eyes: Negative for pain and discharge.  Respiratory: Positive for cough, hemoptysis and sputum production. Negative for shortness of breath.   Cardiovascular: Positive for chest pain. Negative for leg swelling.  Gastrointestinal: Positive for nausea. Negative for abdominal pain, constipation, diarrhea and vomiting.  Genitourinary: Negative for dysuria and urgency.  Musculoskeletal: Positive for back pain and myalgias.  Skin: Negative for itching and rash.  Neurological: Negative for weakness and headaches.  Psychiatric/Behavioral: Negative for substance abuse. The patient is not nervous/anxious.     Patient Active Problem List   Diagnosis Date Noted  . Pneumonia 04/02/2018  . Acute exacerbation of bronchiectasis (HCC) 01/16/2015  . Protein-calorie malnutrition (HCC) 10/30/2014  . Respiratory illness 04/10/2014  . Depression with anxiety 01/02/2014  . Insomnia 05/25/2013  . Rib pain on right side 05/25/2013  . Tracheobronchitis 05/16/2011  . Cystic fibrosis (HCC) 05/13/2011    Past Medical History: Past Medical History:  Diagnosis Date  . Acid reflux   . Cystic fibrosis     Past Surgical History: Past Surgical History:  Procedure Laterality Date  . picc line placements    . right ear surgery     removal of excess skin    Social History: Social History   Tobacco Use  . Smoking status: Former Smoker    Types: Cigarettes    Last attempt to quit: 04/12/2011  Years since quitting: 6.9  . Smokeless tobacco: Never Used  Substance Use Topics  . Alcohol use: Yes    Alcohol/week: 0.0 standard drinks  . Drug use: Yes    Types: Marijuana   Additional social history: denies EtOH use or illicit drug use. Used to smoke 1 ppd about 10 years ago. Lives in [place] with fiance.  Family History: Family History  Problem Relation Age of Onset  .  Brain cancer Paternal Grandfather     Allergies and Medications: Allergies  Allergen Reactions  . Amoxapine And Related   . Ondansetron Hives  . Amoxicillin Hives, Other (See Comments) and Rash    Gas   . Ondansetron Hcl Rash  . Penicillins Hives, Other (See Comments) and Rash    Gas   . Sulfa Antibiotics Rash   No current facility-administered medications on file prior to encounter.    Current Outpatient Medications on File Prior to Encounter  Medication Sig Dispense Refill  . albuterol (PROVENTIL HFA;VENTOLIN HFA) 108 (90 BASE) MCG/ACT inhaler Inhale 2 puffs into the lungs every 4 (four) hours as needed. Wheezing 1 Inhaler 6  . albuterol (PROVENTIL) (2.5 MG/3ML) 0.083% nebulizer solution Take 2.5 mg by nebulization every 6 (six) hours as needed for wheezing or shortness of breath.    Marland Kitchen aspirin 325 MG tablet Take 325 mg by mouth every 6 (six) hours as needed. pain     . azithromycin (ZITHROMAX) 500 MG tablet Take 500 mg by mouth every Monday, Wednesday, and Friday.  1  . budesonide (PULMICORT) 0.5 MG/2ML nebulizer solution Take 0.5 mg by nebulization 2 (two) times daily.    . budesonide-formoterol (SYMBICORT) 160-4.5 MCG/ACT inhaler Inhale 2 puffs into the lungs 2 (two) times daily.    . calcium gluconate 500 MG tablet Take 1 tablet by mouth 2 (two) times daily.     . cholecalciferol (VITAMIN D) 1000 units tablet Take 3,000 Units by mouth daily.  3  . levocetirizine (XYZAL) 5 MG tablet TAKE 1 TABLET BY MOUTH EVERY EVENING (Patient taking differently: Take 5 mg by mouth every evening. ) 30 tablet 0  . Multiple Vitamins-Minerals (MULTIVITAMINS THER. W/MINERALS) TABS Take 1 tablet by mouth daily.      . pantoprazole (PROTONIX) 40 MG tablet TAKE ONE TABLET BY MOUTH TWICE DAILY (Patient taking differently: Take 40 mg by mouth 2 (two) times daily. ) 60 tablet 3  . Tezacaftor-Ivacaftor&Ivacaftor 100-150 & 150 MG TBPK Take 150 mg by mouth 2 (two) times daily.    . ursodiol (ACTIGALL) 250 MG  tablet Take 1 tablet (250 mg total) by mouth 2 (two) times daily. 60 tablet 3  . vitamin C (ASCORBIC ACID) 500 MG tablet Take 500 mg by mouth daily.        Objective: BP 109/66 (BP Location: Left Arm)   Pulse (!) 116   Temp (!) 100.7 F (38.2 C) (Oral)   Resp 20   Ht 5\' 9"  (1.753 m)   Wt 53.8 kg   SpO2 96%   BMI 17.52 kg/m  Physical Exam:  Gen: NAD, alert, non-toxic, well-appearing, sitting comfortably  Skin: Warm and dry. No obvious rashes, lesions, or trauma, warm to palpation with minimal perspiration throughout HEENT: NCAT No conjunctival pallor or injection. No scleral icterus or injection.  MMM. 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  <2s capillary refill bilaterally.  RP & DPs 2+ bilaterally. No BLEE. Resp: CTAB. No obvious crackles appreciated but slight egophony positive over  right lower lung field. No increased WOB on room air, non-productive cough present Abd: NTND on palpation to all 4 quadrants.  Positive bowel sounds. Psych: Cooperative with exam. Pleasant. Makes eye contact. Speech normal. Extremities: Moves all extremities spontaneously   Labs and Imaging: CBC BMET  Recent Labs  Lab 04/02/18 1735  WBC 20.4*  HGB 12.3*  HCT 41.0  PLT 520*   Recent Labs  Lab 04/02/18 1735  NA 135  K 3.7  CL 100  CO2 24  BUN 6  CREATININE 0.81  GLUCOSE 146*  CALCIUM 8.7*     Dg Chest 2 View  Result Date: 04/02/2018 CLINICAL DATA:  Chest pain, dyspnea. EXAM: CHEST - 2 VIEW COMPARISON:  Radiographs of January 13, 2015. FINDINGS: The heart size and mediastinal contours are within normal limits. No pneumothorax or pleural effusion is noted. Stable coarse reticular densities are noted throughout both lungs most consistent with chronic scarring. New airspace opacity is noted in right middle lobe consistent with pneumonia. The visualized skeletal structures are unremarkable. IMPRESSION: New right middle lobe airspace opacity is noted consistent  with pneumonia. Stable scarring is seen throughout both lungs. Electronically Signed   By: Lupita Raider, M.D.   On: 04/02/2018 18:12   Ct Angio Chest Pe W And/or Wo Contrast  Result Date: 04/02/2018 CLINICAL DATA:  Chest pain, dyspnea. EXAM: CT ANGIOGRAPHY CHEST WITH CONTRAST TECHNIQUE: Multidetector CT imaging of the chest was performed using the standard protocol during bolus administration of intravenous contrast. Multiplanar CT image reconstructions and MIPs were obtained to evaluate the vascular anatomy. CONTRAST:  60 mL ISOVUE-370 IOPAMIDOL (ISOVUE-370) INJECTION 76% COMPARISON:  Radiographs of same day. FINDINGS: Cardiovascular: Satisfactory opacification of the pulmonary arteries to the segmental level. No evidence of pulmonary embolism. Normal heart size. No pericardial effusion. Mediastinum/Nodes: Thyroid gland and esophagus are unremarkable. 18 mm subcarinal lymph node is noted. 13 mm right hilar lymph node is noted. 14 mm left hilar lymph node is noted. Lungs/Pleura: No pneumothorax or pleural effusion is noted. Large airspace opacity is seen in right middle lobe most consistent with pneumonia. Multiple nodular densities of varying sizes are noted throughout both lungs, but most prominently in both upper lobes. Bronchiectasis is also noted throughout both lungs. Debris or other soft tissue abnormality is noted in the bronchi of the left lower lobe and right lower lobe. These findings are most likely related to history of cystic fibrosis. Upper Abdomen: No acute abnormality. Musculoskeletal: No chest wall abnormality. No acute or significant osseous findings. Review of the MIP images confirms the above findings. IMPRESSION: No definite evidence of pulmonary embolus. Large right middle lobe airspace opacity is noted consistent with pneumonia. Bronchiectasis is noted throughout both lungs with nodular densities of varying sizes throughout both lungs, which most likely represent scarring or chronic  inflammation an are related to history of cystic fibrosis. Enlarged mediastinal adenopathy is noted which most likely is inflammatory in etiology. Electronically Signed   By: Lupita Raider, M.D.   On: 04/02/2018 20:40    Joana Reamer, DO 04/02/2018, 11:54 PM PGY-1, [facility] FPTS Intern pager: [phone], text pages welcome  Upper Level Addendum: I have seen and evaluated this patient along with Dr. Mauri Reading and reviewed the above note, making necessary revisions in blue.  Durward Parcel, DO [facility], PGY-3

## 2018-04-03 DIAGNOSIS — K769 Liver disease, unspecified: Secondary | ICD-10-CM | POA: Diagnosis not present

## 2018-04-03 DIAGNOSIS — D649 Anemia, unspecified: Secondary | ICD-10-CM | POA: Diagnosis not present

## 2018-04-03 DIAGNOSIS — J189 Pneumonia, unspecified organism: Secondary | ICD-10-CM

## 2018-04-03 DIAGNOSIS — J984 Other disorders of lung: Secondary | ICD-10-CM | POA: Diagnosis not present

## 2018-04-03 DIAGNOSIS — K8689 Other specified diseases of pancreas: Secondary | ICD-10-CM | POA: Diagnosis not present

## 2018-04-03 DIAGNOSIS — G8929 Other chronic pain: Secondary | ICD-10-CM | POA: Diagnosis present

## 2018-04-03 DIAGNOSIS — Z681 Body mass index (BMI) 19 or less, adult: Secondary | ICD-10-CM | POA: Diagnosis not present

## 2018-04-03 DIAGNOSIS — Z87891 Personal history of nicotine dependence: Secondary | ICD-10-CM | POA: Diagnosis not present

## 2018-04-03 DIAGNOSIS — R0600 Dyspnea, unspecified: Secondary | ICD-10-CM | POA: Diagnosis not present

## 2018-04-03 DIAGNOSIS — R000 Tachycardia, unspecified: Secondary | ICD-10-CM | POA: Diagnosis not present

## 2018-04-03 DIAGNOSIS — R079 Chest pain, unspecified: Secondary | ICD-10-CM

## 2018-04-03 DIAGNOSIS — Z882 Allergy status to sulfonamides status: Secondary | ICD-10-CM | POA: Diagnosis not present

## 2018-04-03 DIAGNOSIS — M545 Low back pain: Secondary | ICD-10-CM | POA: Diagnosis present

## 2018-04-03 DIAGNOSIS — Z7982 Long term (current) use of aspirin: Secondary | ICD-10-CM | POA: Diagnosis not present

## 2018-04-03 DIAGNOSIS — J470 Bronchiectasis with acute lower respiratory infection: Secondary | ICD-10-CM | POA: Diagnosis not present

## 2018-04-03 DIAGNOSIS — K219 Gastro-esophageal reflux disease without esophagitis: Secondary | ICD-10-CM | POA: Diagnosis not present

## 2018-04-03 DIAGNOSIS — F419 Anxiety disorder, unspecified: Secondary | ICD-10-CM | POA: Diagnosis not present

## 2018-04-03 DIAGNOSIS — R040 Epistaxis: Secondary | ICD-10-CM | POA: Diagnosis not present

## 2018-04-03 DIAGNOSIS — J181 Lobar pneumonia, unspecified organism: Principal | ICD-10-CM

## 2018-04-03 DIAGNOSIS — Z880 Allergy status to penicillin: Secondary | ICD-10-CM | POA: Diagnosis not present

## 2018-04-03 DIAGNOSIS — E43 Unspecified severe protein-calorie malnutrition: Secondary | ICD-10-CM | POA: Diagnosis not present

## 2018-04-03 DIAGNOSIS — R042 Hemoptysis: Secondary | ICD-10-CM | POA: Diagnosis not present

## 2018-04-03 DIAGNOSIS — Z7951 Long term (current) use of inhaled steroids: Secondary | ICD-10-CM | POA: Diagnosis not present

## 2018-04-03 DIAGNOSIS — Z79899 Other long term (current) drug therapy: Secondary | ICD-10-CM | POA: Diagnosis not present

## 2018-04-03 DIAGNOSIS — E876 Hypokalemia: Secondary | ICD-10-CM | POA: Diagnosis present

## 2018-04-03 LAB — CBC WITH DIFFERENTIAL/PLATELET
Abs Immature Granulocytes: 0.09 10*3/uL — ABNORMAL HIGH (ref 0.00–0.07)
Basophils Absolute: 0 10*3/uL (ref 0.0–0.1)
Basophils Relative: 0 %
EOS ABS: 0.3 10*3/uL (ref 0.0–0.5)
EOS PCT: 2 %
HEMATOCRIT: 35 % — AB (ref 39.0–52.0)
Hemoglobin: 11.2 g/dL — ABNORMAL LOW (ref 13.0–17.0)
IMMATURE GRANULOCYTES: 1 %
LYMPHS ABS: 1.8 10*3/uL (ref 0.7–4.0)
Lymphocytes Relative: 10 %
MCH: 27.7 pg (ref 26.0–34.0)
MCHC: 32 g/dL (ref 30.0–36.0)
MCV: 86.4 fL (ref 80.0–100.0)
MONO ABS: 1.6 10*3/uL — AB (ref 0.1–1.0)
MONOS PCT: 9 %
Neutro Abs: 14.5 10*3/uL — ABNORMAL HIGH (ref 1.7–7.7)
Neutrophils Relative %: 78 %
Platelets: 451 10*3/uL — ABNORMAL HIGH (ref 150–400)
RBC: 4.05 MIL/uL — ABNORMAL LOW (ref 4.22–5.81)
RDW: 13 % (ref 11.5–15.5)
WBC: 18.2 10*3/uL — ABNORMAL HIGH (ref 4.0–10.5)
nRBC: 0 % (ref 0.0–0.2)

## 2018-04-03 LAB — COMPREHENSIVE METABOLIC PANEL
ALT: 12 U/L (ref 0–44)
AST: 14 U/L — AB (ref 15–41)
Albumin: 2.4 g/dL — ABNORMAL LOW (ref 3.5–5.0)
Alkaline Phosphatase: 135 U/L — ABNORMAL HIGH (ref 38–126)
Anion gap: 11 (ref 5–15)
BILIRUBIN TOTAL: 0.6 mg/dL (ref 0.3–1.2)
BUN: 5 mg/dL — AB (ref 6–20)
CO2: 24 mmol/L (ref 22–32)
Calcium: 8.4 mg/dL — ABNORMAL LOW (ref 8.9–10.3)
Chloride: 102 mmol/L (ref 98–111)
Creatinine, Ser: 0.75 mg/dL (ref 0.61–1.24)
Glucose, Bld: 125 mg/dL — ABNORMAL HIGH (ref 70–99)
POTASSIUM: 4.2 mmol/L (ref 3.5–5.1)
Sodium: 137 mmol/L (ref 135–145)
TOTAL PROTEIN: 6.9 g/dL (ref 6.5–8.1)

## 2018-04-03 LAB — EXPECTORATED SPUTUM ASSESSMENT W REFEX TO RESP CULTURE: SPECIAL REQUESTS: NORMAL

## 2018-04-03 LAB — HEMOGLOBIN A1C
HEMOGLOBIN A1C: 5.8 % — AB (ref 4.8–5.6)
MEAN PLASMA GLUCOSE: 119.76 mg/dL

## 2018-04-03 LAB — EXPECTORATED SPUTUM ASSESSMENT W GRAM STAIN, RFLX TO RESP C

## 2018-04-03 LAB — STREP PNEUMONIAE URINARY ANTIGEN: STREP PNEUMO URINARY ANTIGEN: NEGATIVE

## 2018-04-03 MED ORDER — VANCOMYCIN HCL IN DEXTROSE 1-5 GM/200ML-% IV SOLN
1000.0000 mg | Freq: Once | INTRAVENOUS | Status: AC
  Administered 2018-04-03: 1000 mg via INTRAVENOUS
  Filled 2018-04-03: qty 200

## 2018-04-03 MED ORDER — ALBUTEROL SULFATE (2.5 MG/3ML) 0.083% IN NEBU: 2.5000 mg | INHALATION_SOLUTION | RESPIRATORY_TRACT | Status: DC | PRN

## 2018-04-03 MED ORDER — BOOST / RESOURCE BREEZE PO LIQD CUSTOM
1.0000 | Freq: Three times a day (TID) | ORAL | Status: DC
  Administered 2018-04-03 (×2): 1 via ORAL
  Administered 2018-04-04: 237 mL via ORAL
  Filled 2018-04-03 (×2): qty 1

## 2018-04-03 MED ORDER — TEZACAFTOR-IVACAFTOR&IVACAFTOR 100-150 & 150 MG PO TBPK
150.0000 mg | ORAL_TABLET | Freq: Two times a day (BID) | ORAL | Status: DC
  Administered 2018-04-04 – 2018-04-06 (×5): 150 mg via ORAL
  Administered 2018-04-07: 11:00:00 via ORAL
  Filled 2018-04-03 (×8): qty 1

## 2018-04-03 MED ORDER — ADULT MULTIVITAMIN W/MINERALS CH
1.0000 | ORAL_TABLET | Freq: Every day | ORAL | Status: DC
  Administered 2018-04-03 – 2018-04-07 (×5): 1 via ORAL
  Filled 2018-04-03 (×5): qty 1

## 2018-04-03 MED ORDER — VITAMIN C 500 MG PO TABS
500.0000 mg | ORAL_TABLET | Freq: Every day | ORAL | Status: DC
  Administered 2018-04-03 – 2018-04-07 (×5): 500 mg via ORAL
  Filled 2018-04-03 (×5): qty 1

## 2018-04-03 MED ORDER — SODIUM CHLORIDE 0.9% FLUSH: 3.0000 mL | INTRAVENOUS | Status: DC | PRN

## 2018-04-03 MED ORDER — PANTOPRAZOLE SODIUM 40 MG PO TBEC
40.0000 mg | DELAYED_RELEASE_TABLET | Freq: Two times a day (BID) | ORAL | Status: DC
  Administered 2018-04-03 – 2018-04-07 (×9): 40 mg via ORAL
  Filled 2018-04-03 (×9): qty 1

## 2018-04-03 MED ORDER — SODIUM CHLORIDE 0.9 % IV SOLN
250.0000 mL | INTRAVENOUS | Status: DC | PRN
  Administered 2018-04-05: 250 mL via INTRAVENOUS

## 2018-04-03 MED ORDER — URSODIOL 300 MG PO CAPS: 300.0000 mg | ORAL_CAPSULE | Freq: Two times a day (BID) | ORAL | Status: DC

## 2018-04-03 MED ORDER — PANCRELIPASE (LIP-PROT-AMYL) 12000-38000 UNITS PO CPEP: 24000.0000 [IU] | ORAL_CAPSULE | Freq: Three times a day (TID) | ORAL | Status: DC

## 2018-04-03 MED ORDER — SODIUM CHLORIDE 0.9% FLUSH
3.0000 mL | Freq: Two times a day (BID) | INTRAVENOUS | Status: DC
  Administered 2018-04-03 – 2018-04-07 (×4): 3 mL via INTRAVENOUS

## 2018-04-03 MED ORDER — HYDROMORPHONE HCL 1 MG/ML IJ SOLN
0.5000 mg | Freq: Once | INTRAMUSCULAR | Status: AC
  Administered 2018-04-03: 0.5 mg via INTRAVENOUS
  Filled 2018-04-03: qty 1

## 2018-04-03 MED ORDER — CALCIUM CARBONATE ANTACID 500 MG PO CHEW
1.0000 | CHEWABLE_TABLET | Freq: Three times a day (TID) | ORAL | Status: DC
  Administered 2018-04-03 – 2018-04-07 (×12): 200 mg via ORAL
  Filled 2018-04-03 (×12): qty 1

## 2018-04-03 MED ORDER — MOMETASONE FURO-FORMOTEROL FUM 200-5 MCG/ACT IN AERO
2.0000 | INHALATION_SPRAY | Freq: Two times a day (BID) | RESPIRATORY_TRACT | Status: DC
  Administered 2018-04-04 – 2018-04-07 (×6): 2 via RESPIRATORY_TRACT
  Filled 2018-04-03 (×2): qty 8.8

## 2018-04-03 MED ORDER — CALCIUM GLUCONATE 500 MG PO TABS
1.0000 | ORAL_TABLET | Freq: Three times a day (TID) | ORAL | Status: DC
  Filled 2018-04-03 (×3): qty 1

## 2018-04-03 MED ORDER — CALCIUM GLUCONATE 500 MG PO TABS
1.0000 | ORAL_TABLET | Freq: Three times a day (TID) | ORAL | Status: DC
  Filled 2018-04-03: qty 1

## 2018-04-03 MED ORDER — VITAMIN D 1000 UNITS PO TABS
3000.0000 [IU] | ORAL_TABLET | Freq: Every day | ORAL | Status: DC
  Administered 2018-04-03 – 2018-04-07 (×5): 3000 [IU] via ORAL
  Filled 2018-04-03 (×5): qty 3

## 2018-04-03 MED ORDER — VANCOMYCIN HCL IN DEXTROSE 750-5 MG/150ML-% IV SOLN
750.0000 mg | Freq: Three times a day (TID) | INTRAVENOUS | Status: DC
  Administered 2018-04-03 – 2018-04-04 (×4): 750 mg via INTRAVENOUS
  Filled 2018-04-03 (×8): qty 150

## 2018-04-03 MED ORDER — KETOROLAC TROMETHAMINE 30 MG/ML IJ SOLN
30.0000 mg | Freq: Four times a day (QID) | INTRAMUSCULAR | Status: DC | PRN
  Administered 2018-04-03 – 2018-04-06 (×7): 30 mg via INTRAVENOUS
  Filled 2018-04-03 (×7): qty 1

## 2018-04-03 NOTE — Progress Notes (Signed)
Pharmacy Antibiotic Note  [name] [name] is a 27 y.o. male admitted on 04/02/2018 with pneumonia in setting of cystic fibrosis.  Pharmacy has been consulted to broaden ABX with vancomycin.  Plan: Vancomycin 1000mg  x1 then 750mg  IV every 8 hours.  Goal trough 15-20 mcg/mL.  Height: 5\' 9"  (175.3 cm) Weight: 118 lb 9.7 oz (53.8 kg) IBW/kg (Calculated) : 70.7  Temp (24hrs), Avg:100.1 F (37.8 C), Min:99.8 F (37.7 C), Max:100.7 F (38.2 C)  Recent Labs  Lab 04/02/18 1735  WBC 20.4*  CREATININE 0.81    Estimated Creatinine Clearance: 104.2 mL/min (by C-G formula based on SCr of 0.81 mg/dL).    Allergies  Allergen Reactions  . Amoxapine And Related   . Ondansetron Hives  . Amoxicillin Hives, Other (See Comments) and Rash    Gas   . Ondansetron Hcl Rash  . Penicillins Hives, Other (See Comments) and Rash    Gas   . Sulfa Antibiotics Rash    Thank you for allowing pharmacy to be a part of this patient's care.  Vernard Gambles, PharmD, BCPS  04/03/2018 12:10 AM

## 2018-04-03 NOTE — Progress Notes (Signed)
RN informed by Ilda Basset that we do not supply, Pt's CF Rxs and that Pt needs to have home supply brought in. RN informed Pt, per Pt he will see if he can get someone to bring it in.

## 2018-04-03 NOTE — Progress Notes (Signed)
[name] [name] [id number] Admission Data: 04/03/2018 7:58 PM Attending Provider: Nestor Ramp, MD  [id number], Velna Hatchet, MD Consults/ Treatment Team:   Vincent Gros is a 27 y.o. male patient admitted from ED awake, alert  & orientated  X 3,  Full Code, VSS - Blood pressure 110/88, pulse 92, temperature 98.6 F (37 C), resp. rate 16, height 5\' 9"  (1.753 m), weight 53.8 kg, SpO2 99 %., Room Air, no c/o shortness of breath, no c/o chest pain, no distress noted.   IV site WDL: Right Antecubital with a transparent dsg that's clean dry and intact.  Allergies:   Allergies  Allergen Reactions  . Amoxapine And Related   . Ondansetron Hives  . Amoxicillin Hives, Other (See Comments) and Rash    Gas   . Ondansetron Hcl Rash  . Penicillins Hives, Other (See Comments) and Rash    Gas   . Sulfa Antibiotics Rash     Past Medical History:  Diagnosis Date  . Acid reflux   . Cystic fibrosis     Pt orientation to unit, room and routine. Information packet given to patient/family and safety video watched.  Admission INP armband ID verified with patient/family, and in place. SR up x 2, fall risk assessment complete with Patient and family verbalizing understanding of risks associated with falls. Pt verbalizes an understanding of how to use the call bell and to call for help before getting out of bed.  Skin, clean-dry- intact without evidence of bruising, or skin tears.   No evidence of skin break down noted on exam.   Will cont to monitor and assist as needed.   Melina Modena, RN 04/03/2018

## 2018-04-03 NOTE — Progress Notes (Signed)
Assumed care on pt. , pt. Denies chest pain at this time , respirations unlabored with persistent dry cough , IV site intact , plan of care explained to pt.

## 2018-04-03 NOTE — Progress Notes (Signed)
Family Medicine Teaching Service Daily Progress Note Intern Pager: [phone]  Patient name: [name] [name] Medical record number: [id number] Date of birth: Jan 21, 1991 Age: 27 y.o. Gender: male  Primary Care Provider: Salley Scarlet, MD Consultants: None Code Status: Full code  Pt Overview and Major Events to Date:  04/02/2018 Admitted Hospital Day: 2   Assessment and Plan: [name] [name] is a 27 y.o. male who presented with right sided chest pain found to have right middle lobe pneumonia. PMHx is significant for Cystic fibrosis, anxiety, and GERD.  Right-middle lobe pneumonia:  Patient did well overnight. Right middle lobe pneumonia found on CXR and CTA chest most likely secondary to chronic cystic fibrosis although no history of recent illness. Currently on IV cefepime and vancomycin for broad spectrum coverage. Overnight patient has been hemodynamically stable and afebrile. Complains of severe right sided chest pain, 9/10, not relieved with naproxen or tylenol. Did note relief with Dilaudid in ED. Repeat EKG pending. AM CBC and CMP pending. Sputum and blood culture pending. Strep pneumo and Legionella Ag pending. Questionable benefit with viral prophylaxis with Tamiflu. Will consider adding.  - Continue IV Cefepime and Vancomycin  - Consider transition to oral antibiotics pending cultures - Continue Albuterol q2 PRN - Continue Dulera BID - Follow-up blood and sputum cx - Follow-up Strep Pneumo and Legionella - AM CBC and BMP pending, will follow-up - Continue to encourage Incentive spirometry - Begin chest physiotherapy - Continuous pulse ox w/ O2 as needed - Tylenol and Naproxen PRN - IV Dilaudid 0.5mg  x 1 for pain - Consider Tamiflu for viral prophylaxis  Cystic Fibrosis I Chronic Liver disease I Pancreatic Insufficiency: chronic stable On home Creon TID, Albuterol, MTVI, Pulmicort neb, Symbicort inhaler, Azithromycin 500mg  (MWF), Ursodiol, Levocetirizine, Tezacaftor-Ivacaftor.  Followed by [facility] with last appt Nov. 2018 and upcoming appt in 04/08/18.  - Continue MTVI's and enzyme replacement therapy - Holding home azithromycin - Tezacaftor-Ivacaftor not available per pharm, pt informed to bring from home - A1C pending, will follow-up  Hemoptysis I Epistaxis I Anemia: chronic Hemoglobin 12.3. Baseline 12. AM CBC pending. Epistaxis possibly source of hemoptysis.  - CBC pending, will follow-up  Chronic low back pain: stable, mild Associated with chronic cough and posture. Treats with Naproxen 500mg .  - K-pad - Tylenol and Naproxen PRN  Moderate Protein-calorie malnutrition: chronic Dx'd in 2016. BMP WNL on admission. Supplements with Boost with meals TID. - Regular diet - Continue Boost supplementation TID w/ meals - Continue home MTVI - Nutrition consulted, will follow up  Anxiety: chronic, stable Has taken zoloft in the past. No longer taking. Denies any current anxiety. - continue to monitor  GERD: chronic, stable - Continue home Protonix 40mg  BID  Fluids: Saline lock . ceFEPime (MAXIPIME) IV 2 g (04/03/18 0541)  . vancomycin 750 mg (04/03/18 0542)  Electrolytes: Replace PRN Nutrition: Regular diet with boost supplementation  GI ppx: Protonix  DVT ppx: SCDs  Disposition: home pending improvement   Medications: Scheduled Meds: .  HYDROmorphone (DILAUDID) injection  0.5 mg Intravenous Once  . iopamidol       Continuous Infusions: . ceFEPime (MAXIPIME) IV 2 g (04/03/18 0541)  . vancomycin 750 mg (04/03/18 0542)   PRN Meds: acetaminophen, naproxen  ================================================= ================================================= Subjective:  Patient reports doing well overnight. Pt has had good appetite and normal voids and bowel movements. Patient does complain of extreme pain in his right chest this morning that has not been relieved with naproxen which normally helps at home. He  notes that dose of dilaudid in ED helped  ease the pain.  Objective: Vital Signs Temp:  [98.8 F (37.1 C)-100.7 F (38.2 C)] 98.8 F (37.1 C) (10/19 0440) Pulse Rate:  [92-130] 92 (10/19 0440) Resp:  [14-38] 18 (10/19 0440) BP: (93-120)/(60-79) 93/60 (10/19 0440) SpO2:  [94 %-98 %] 94 % (10/19 0440) Weight:  [53.8 kg-61.7 kg] 53.8 kg (10/18 2300)  Intake/Output 10/18 0701 - 10/19 0700 In: 300 [IV Piggyback:300] Out: 0   Physical Exam:  Gen: NAD, alert, non-toxic, well-appearing, sitting but appears uncomfortable Skin: Warm and dry. No obvious rashes, lesions, or trauma, warm to palpation with minimal perspiration throughout. HEENT: NCAT No conjunctival pallor or injection. No scleral icterus or injection.  MMM. 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 <2s capillary refill bilaterally.  RP & DPs 2+ bilaterally. No BLEE. Resp: Minimal change to lung exam from earlier in night.  No increased WOB on room air, non-productive cough present Abd: NTND on palpation to all 4 quadrants.  Positive bowel sounds. Psych: Cooperative with exam. Pleasant. Makes eye contact. Speech normal. Extremities: Moves all extremities spontaneously   Laboratory: Recent Labs  Lab 04/02/18 1735  WBC 20.4*  HGB 12.3*  HCT 41.0  PLT 520*   Recent Labs  Lab 04/02/18 1735  NA 135  K 3.7  CL 100  CO2 24  BUN 6  CREATININE 0.81  CALCIUM 8.7*  GLUCOSE 146*   Imaging/Diagnostic Tests: Dg Chest 2 View  Result Date: 04/02/2018 CLINICAL DATA:  Chest pain, dyspnea. EXAM: CHEST - 2 VIEW COMPARISON:  Radiographs of January 13, 2015. FINDINGS: The heart size and mediastinal contours are within normal limits. No pneumothorax or pleural effusion is noted. Stable coarse reticular densities are noted throughout both lungs most consistent with chronic scarring. New airspace opacity is noted in right middle lobe consistent with pneumonia. The visualized skeletal structures are unremarkable. IMPRESSION: New right middle  lobe airspace opacity is noted consistent with pneumonia. Stable scarring is seen throughout both lungs. Electronically Signed   By: Lupita Raider, M.D.   On: 04/02/2018 18:12   Ct Angio Chest Pe W And/or Wo Contrast  Result Date: 04/02/2018 CLINICAL DATA:  Chest pain, dyspnea. EXAM: CT ANGIOGRAPHY CHEST WITH CONTRAST TECHNIQUE: Multidetector CT imaging of the chest was performed using the standard protocol during bolus administration of intravenous contrast. Multiplanar CT image reconstructions and MIPs were obtained to evaluate the vascular anatomy. CONTRAST:  60 mL ISOVUE-370 IOPAMIDOL (ISOVUE-370) INJECTION 76% COMPARISON:  Radiographs of same day. FINDINGS: Cardiovascular: Satisfactory opacification of the pulmonary arteries to the segmental level. No evidence of pulmonary embolism. Normal heart size. No pericardial effusion. Mediastinum/Nodes: Thyroid gland and esophagus are unremarkable. 18 mm subcarinal lymph node is noted. 13 mm right hilar lymph node is noted. 14 mm left hilar lymph node is noted. Lungs/Pleura: No pneumothorax or pleural effusion is noted. Large airspace opacity is seen in right middle lobe most consistent with pneumonia. Multiple nodular densities of varying sizes are noted throughout both lungs, but most prominently in both upper lobes. Bronchiectasis is also noted throughout both lungs. Debris or other soft tissue abnormality is noted in the bronchi of the left lower lobe and right lower lobe. These findings are most likely related to history of cystic fibrosis. Upper Abdomen: No acute abnormality. Musculoskeletal: No chest wall abnormality. No acute or significant osseous findings. Review of the MIP images confirms the above findings. IMPRESSION: No definite evidence of pulmonary embolus.  Large right middle lobe airspace opacity is noted consistent with pneumonia. Bronchiectasis is noted throughout both lungs with nodular densities of varying sizes throughout both lungs, which  most likely represent scarring or chronic inflammation an are related to history of cystic fibrosis. Enlarged mediastinal adenopathy is noted which most likely is inflammatory in etiology. Electronically Signed   By: Lupita Raider, M.D.   On: 04/02/2018 20:40    Joana Reamer, DO 04/03/2018, 8:01 AM PGY-1, [facility] FPTS Intern pager: [phone], text pages welcome

## 2018-04-04 LAB — CBC WITH DIFFERENTIAL/PLATELET
ABS IMMATURE GRANULOCYTES: 0.08 10*3/uL — AB (ref 0.00–0.07)
Basophils Absolute: 0.1 10*3/uL (ref 0.0–0.1)
Basophils Relative: 0 %
EOS PCT: 4 %
Eosinophils Absolute: 0.6 10*3/uL — ABNORMAL HIGH (ref 0.0–0.5)
HCT: 32.8 % — ABNORMAL LOW (ref 39.0–52.0)
Hemoglobin: 10.4 g/dL — ABNORMAL LOW (ref 13.0–17.0)
Immature Granulocytes: 1 %
Lymphocytes Relative: 13 %
Lymphs Abs: 1.9 10*3/uL (ref 0.7–4.0)
MCH: 27.6 pg (ref 26.0–34.0)
MCHC: 31.7 g/dL (ref 30.0–36.0)
MCV: 87 fL (ref 80.0–100.0)
MONO ABS: 1.2 10*3/uL — AB (ref 0.1–1.0)
Monocytes Relative: 8 %
NEUTROS ABS: 11.1 10*3/uL — AB (ref 1.7–7.7)
Neutrophils Relative %: 74 %
Platelets: 448 10*3/uL — ABNORMAL HIGH (ref 150–400)
RBC: 3.77 MIL/uL — AB (ref 4.22–5.81)
RDW: 13.1 % (ref 11.5–15.5)
WBC: 14.8 10*3/uL — AB (ref 4.0–10.5)
nRBC: 0 % (ref 0.0–0.2)

## 2018-04-04 LAB — BASIC METABOLIC PANEL
Anion gap: 7 (ref 5–15)
BUN: 6 mg/dL (ref 6–20)
CO2: 25 mmol/L (ref 22–32)
CREATININE: 0.68 mg/dL (ref 0.61–1.24)
Calcium: 8.3 mg/dL — ABNORMAL LOW (ref 8.9–10.3)
Chloride: 103 mmol/L (ref 98–111)
GFR calc Af Amer: >60 mL/min (ref >60)
GLUCOSE: 126 mg/dL — AB (ref 70–99)
Potassium: 3.3 mmol/L — ABNORMAL LOW (ref 3.5–5.1)
SODIUM: 135 mmol/L (ref 135–145)

## 2018-04-04 LAB — VANCOMYCIN, TROUGH: VANCOMYCIN TR: 13 ug/mL — AB (ref 15–20)

## 2018-04-04 LAB — LEGIONELLA PNEUMOPHILA SEROGP 1 UR AG: L. pneumophila Serogp 1 Ur Ag: NEGATIVE

## 2018-04-04 LAB — HIV ANTIBODY (ROUTINE TESTING W REFLEX): HIV Screen 4th Generation wRfx: NONREACTIVE

## 2018-04-04 MED ORDER — VANCOMYCIN HCL IN DEXTROSE 1-5 GM/200ML-% IV SOLN
1000.0000 mg | Freq: Three times a day (TID) | INTRAVENOUS | Status: DC
  Administered 2018-04-05 – 2018-04-06 (×5): 1000 mg via INTRAVENOUS
  Filled 2018-04-04 (×6): qty 200

## 2018-04-04 MED ORDER — VANCOMYCIN HCL IN DEXTROSE 750-5 MG/150ML-% IV SOLN
750.0000 mg | Freq: Once | INTRAVENOUS | Status: AC
  Administered 2018-04-04: 750 mg via INTRAVENOUS
  Filled 2018-04-04: qty 150

## 2018-04-04 MED ORDER — ACETAMINOPHEN-CODEINE #3 300-30 MG PO TABS
1.0000 | ORAL_TABLET | Freq: Four times a day (QID) | ORAL | Status: DC | PRN
  Administered 2018-04-04 – 2018-04-07 (×6): 1 via ORAL
  Filled 2018-04-04 (×7): qty 1

## 2018-04-04 MED ORDER — POTASSIUM CHLORIDE CRYS ER 20 MEQ PO TBCR
40.0000 meq | EXTENDED_RELEASE_TABLET | Freq: Two times a day (BID) | ORAL | Status: AC
  Administered 2018-04-04 (×2): 40 meq via ORAL
  Filled 2018-04-04 (×2): qty 2

## 2018-04-04 NOTE — Progress Notes (Signed)
Pharmacy Antibiotic Note  [name] [name] is a 27 y.o. male admitted on 04/02/2018 with pneumonia in setting of cystic fibrosis.  Pharmacy has been consulted for Cefepime and Vancomycin dosing.  A Vancomycin trough this evening is SUBtherapeutic (VT 13 mcg/ml, goal of 15-20 mcg/ml). Renal function remains stable. Will increase the Vancomycin dose starting tomorrow.   Plan: - Increase Vancomycin to 1g IV every 8 hours - Continue Cefepime 2g IV every 8 hours - Will continue to follow renal function, culture results, LOT, and antibiotic de-escalation plans   Height: 5\' 9"  (175.3 cm) Weight: 118 lb 9.7 oz (53.8 kg) IBW/kg (Calculated) : 70.7  Temp (24hrs), Avg:98.7 F (37.1 C), Min:98.6 F (37 C), Max:98.8 F (37.1 C)  Recent Labs  Lab 04/02/18 1735 04/03/18 0938 04/04/18 0323  WBC 20.4* 18.2* 14.8*  CREATININE 0.81 0.75 0.68    Estimated Creatinine Clearance: 105.5 mL/min (by C-G formula based on SCr of 0.68 mg/dL).    Allergies  Allergen Reactions  . Amoxapine And Related   . Ondansetron Hives  . Amoxicillin Hives, Other (See Comments) and Rash    Gas   . Ondansetron Hcl Rash  . Penicillins Hives, Other (See Comments) and Rash    Gas   . Sulfa Antibiotics Rash    Cefepime 10/18>>   Vanc 10/19>>  Bcx 10/18: NGTD Bcx 10/19: ngtd Sputum 10/19 >> gram stain with GPC/GPR/GNR >> pending Strep pneumo urine: neg  Thank you for allowing pharmacy to be a part of this patient's care.  Georgina Pillion, PharmD, BCPS Clinical Pharmacist Pager: [phone] Clinical phone for 04/04/2018 from 7a-3:30p: [phone] If after 3:30p, please call main pharmacy at: [phone] Please check AMION for all [facility] Pharm[facility] numbers 04/04/2018 3:07 PM

## 2018-04-04 NOTE — Plan of Care (Signed)
  Problem: Activity: Goal: Ability to tolerate increased activity will improve Outcome: Progressing   Problem: Respiratory: Goal: Ability to maintain adequate ventilation will improve Outcome: Progressing   

## 2018-04-04 NOTE — Progress Notes (Signed)
Family Medicine Teaching Service Daily Progress Note Intern Pager: [phone]  Patient name: [name] [name] Medical record number: [id number] Date of birth: 08/28/1990 Age: 27 y.o. Gender: male  Primary Care Provider: Salley Scarlet, MD Consultants: None Code Status: Full code  Pt Overview and Major Events to Date:  04/02/2018 Admitted Hospital Day: 3   Assessment and Plan: Aragon BJ MORLOCK is a 27 y.o. male who presented with right sided chest pain found to have right middle lobe pneumonia. PMHx is significant for Cystic fibrosis, anxiety, and GERD.  Right-middle lobe pneumonia, stable Patient did well overnight. Right middle lobe pneumonia found on CXR and CTA chest most likely secondary to chronic cystic fibrosis although no history of recent illness. Currently on IV cefepime and vancomycin for broad spectrum coverage. Overnight patient has been hemodynamically stable and afebrile. Complains of severe right sided chest pain, 9/10, not relieved with naproxen or tylenol. Did note relief with Dilaudid in ED. Leukocytosis is improving 20.4>>18.2>>14.8.  - Will continue IV Cefepime and Vancomycin --Follow up on blood and sputum cultures - Continue Albuterol q2 PRN - Continue Dulera BID - Follow-up Strep Pneumo and Legionella - Follow up on am CBC and BMP  - Incentive spirometry - Begin chest physiotherapy - Continuous pulse ox w/ O2 as needed - Tylenol and Naproxen PRN - Consider Tamiflu for viral prophylaxis  Cystic Fibrosis I Chronic Liver disease I Pancreatic Insufficiency, chronic, stable On home Creon TID, Albuterol, MTVI, Pulmicort neb, Symbicort inhaler, Azithromycin 500 mg (MWF), Ursodiol, Levocetirizine, Tezacaftor-Ivacaftor. Followed by [facility] with last appt Nov. 2018 and upcoming appt in 04/08/18. A1c 5.8. - Continue MTVI's and enzyme replacement therapy - Holding home azithromycin - Tezacaftor-Ivacaftor not available per pharm, pt informed to bring from home  Hemoptysis I  Epistaxis I Anemia, chronic Hemoglobin 12.3. Baseline 12. AM. Epistaxis possibly source of hemoptysis given perforated septum. Hbg 12.3>11.2>10.4. - Follow up on am CBC --Outpatient follow up with ENT for perforated septum  Hypokalemia K+ 3.4 this morning. --Replete as needed, Kdur 40 mEq x2 --Follow up on am BMP  Chronic low back pain: stable, mild Associated with chronic cough and posture. Treats with Naproxen 500mg .  - K-pad - Tylenol and Naproxen PRN  Moderate/Severe Protein-calorie malnutrition, chronic Albumin on admission was 2.4, supplements with Boost with meals TID. - Regular diet - Continue Boost supplementation TID w/ meals - Continue home MTVI - Nutrition consulted, will follow up  Anxiety: chronic, stable Has taken zoloft in the past. No longer taking. Denies any current anxiety. - continue to monitor  GERD: chronic, stable - Continue home Protonix 40mg  BID  Fluids: Saline lock . sodium chloride    . ceFEPime (MAXIPIME) IV 2 g (04/04/18 0505)  . vancomycin 750 mg (04/04/18 0210)  Electrolytes: Replace PRN Nutrition: Regular diet with boost supplementation  GI ppx: Protonix  DVT ppx: SCDs  Disposition: home pending improvement  Subjective:  Patient feeling a bit better today, still reports cough and right sided pain but not as severe. He is using incentive spirometry. Patient with fair appetite, ambulating denies any nausea or vomiting. Denies any epistaxis.  Objective: Vital Signs Temp:  [98.6 F (37 C)-98.8 F (37.1 C)] 98.8 F (37.1 C) (10/20 0508) Pulse Rate:  [78-101] 78 (10/20 0508) Resp:  [16-19] 18 (10/19 2142) BP: (93-115)/(66-88) 93/66 (10/20 0508) SpO2:  [95 %-99 %] 99 % (10/20 0508)  Intake/Output 10/19 0701 - 10/20 0700 In: 360 [P.O.:360] Out: -   Physical Exam:  Gen: NAD, alert, non-toxic,  well-appearing, answering questions appropriately and able to participate in exam Skin: Warm and dry. No obvious rashes, lesions, or trauma,  warm to palpation with minimal perspiration throughout. HEENT: NCAT No conjunctival pallor or injection. No scleral icterus or injection.  MMM. 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 <2s capillary refill bilaterally.  RP & DPs 2+ bilaterally. No BLEE. Resp: Minimal change to lung exam from earlier in night.  No increased WOB on room air, non-productive cough present Abd: NTND on palpation to all 4 quadrants.  Positive bowel sounds. Psych: Behavior and mood appropriate, somewhat flat affect Extremities: Moves all extremities spontaneously   Laboratory: Recent Labs  Lab 04/02/18 1735 04/03/18 0938 04/04/18 0323  WBC 20.4* 18.2* 14.8*  HGB 12.3* 11.2* 10.4*  HCT 41.0 35.0* 32.8*  PLT 520* 451* 448*   Recent Labs  Lab 04/02/18 1735 04/03/18 0938 04/04/18 0323  NA 135 137 135  K 3.7 4.2 3.3*  CL 100 102 103  CO2 24 24 25   BUN 6 5* 6  CREATININE 0.81 0.75 0.68  CALCIUM 8.7* 8.4* 8.3*  PROT  --  6.9  --   BILITOT  --  0.6  --   ALKPHOS  --  135*  --   ALT  --  12  --   AST  --  14*  --   GLUCOSE 146* 125* 126*   Imaging/Diagnostic Tests: Dg Chest 2 View  Result Date: 04/02/2018 CLINICAL DATA:  Chest pain, dyspnea. EXAM: CHEST - 2 VIEW COMPARISON:  Radiographs of January 13, 2015. FINDINGS: The heart size and mediastinal contours are within normal limits. No pneumothorax or pleural effusion is noted. Stable coarse reticular densities are noted throughout both lungs most consistent with chronic scarring. New airspace opacity is noted in right middle lobe consistent with pneumonia. The visualized skeletal structures are unremarkable. IMPRESSION: New right middle lobe airspace opacity is noted consistent with pneumonia. Stable scarring is seen throughout both lungs. Electronically Signed   By: Lupita Raider, M.D.   On: 04/02/2018 18:12   Ct Angio Chest Pe W And/or Wo Contrast  Result Date: 04/02/2018 CLINICAL DATA:  Chest pain, dyspnea. EXAM:  CT ANGIOGRAPHY CHEST WITH CONTRAST TECHNIQUE: Multidetector CT imaging of the chest was performed using the standard protocol during bolus administration of intravenous contrast. Multiplanar CT image reconstructions and MIPs were obtained to evaluate the vascular anatomy. CONTRAST:  60 mL ISOVUE-370 IOPAMIDOL (ISOVUE-370) INJECTION 76% COMPARISON:  Radiographs of same day. FINDINGS: Cardiovascular: Satisfactory opacification of the pulmonary arteries to the segmental level. No evidence of pulmonary embolism. Normal heart size. No pericardial effusion. Mediastinum/Nodes: Thyroid gland and esophagus are unremarkable. 18 mm subcarinal lymph node is noted. 13 mm right hilar lymph node is noted. 14 mm left hilar lymph node is noted. Lungs/Pleura: No pneumothorax or pleural effusion is noted. Large airspace opacity is seen in right middle lobe most consistent with pneumonia. Multiple nodular densities of varying sizes are noted throughout both lungs, but most prominently in both upper lobes. Bronchiectasis is also noted throughout both lungs. Debris or other soft tissue abnormality is noted in the bronchi of the left lower lobe and right lower lobe. These findings are most likely related to history of cystic fibrosis. Upper Abdomen: No acute abnormality. Musculoskeletal: No chest wall abnormality. No acute or significant osseous findings. Review of the MIP images confirms the above findings. IMPRESSION: No definite evidence of pulmonary embolus. Large right middle lobe airspace opacity is noted  consistent with pneumonia. Bronchiectasis is noted throughout both lungs with nodular densities of varying sizes throughout both lungs, which most likely represent scarring or chronic inflammation an are related to history of cystic fibrosis. Enlarged mediastinal adenopathy is noted which most likely is inflammatory in etiology. Electronically Signed   By: Lupita Raider, M.D.   On: 04/02/2018 20:40    Lovena Neighbours,  MD [facility], PGY-3 04/04/2018, 7:49 AM FPTS Intern pager: [phone], text pages welcome

## 2018-04-05 DIAGNOSIS — K219 Gastro-esophageal reflux disease without esophagitis: Secondary | ICD-10-CM

## 2018-04-05 DIAGNOSIS — E43 Unspecified severe protein-calorie malnutrition: Secondary | ICD-10-CM

## 2018-04-05 LAB — CBC WITH DIFFERENTIAL/PLATELET
ABS IMMATURE GRANULOCYTES: 0.06 10*3/uL (ref 0.00–0.07)
BASOS ABS: 0 10*3/uL (ref 0.0–0.1)
Basophils Relative: 0 %
EOS PCT: 4 %
Eosinophils Absolute: 0.5 10*3/uL (ref 0.0–0.5)
HEMATOCRIT: 32.9 % — AB (ref 39.0–52.0)
HEMOGLOBIN: 10.2 g/dL — AB (ref 13.0–17.0)
Immature Granulocytes: 1 %
LYMPHS PCT: 20 %
Lymphs Abs: 2.2 10*3/uL (ref 0.7–4.0)
MCH: 27.4 pg (ref 26.0–34.0)
MCHC: 31 g/dL (ref 30.0–36.0)
MCV: 88.4 fL (ref 80.0–100.0)
MONO ABS: 0.9 10*3/uL (ref 0.1–1.0)
Monocytes Relative: 8 %
NEUTROS ABS: 7.6 10*3/uL (ref 1.7–7.7)
Neutrophils Relative %: 67 %
Platelets: 429 10*3/uL — ABNORMAL HIGH (ref 150–400)
RBC: 3.72 MIL/uL — ABNORMAL LOW (ref 4.22–5.81)
RDW: 13.1 % (ref 11.5–15.5)
WBC: 11.2 10*3/uL — ABNORMAL HIGH (ref 4.0–10.5)
nRBC: 0 % (ref 0.0–0.2)

## 2018-04-05 LAB — BASIC METABOLIC PANEL
Anion gap: 7 (ref 5–15)
BUN: 7 mg/dL (ref 6–20)
CO2: 22 mmol/L (ref 22–32)
CREATININE: 0.67 mg/dL (ref 0.61–1.24)
Calcium: 8.2 mg/dL — ABNORMAL LOW (ref 8.9–10.3)
Chloride: 109 mmol/L (ref 98–111)
GFR calc non Af Amer: >60 mL/min (ref >60)
Glucose, Bld: 115 mg/dL — ABNORMAL HIGH (ref 70–99)
POTASSIUM: 4.1 mmol/L (ref 3.5–5.1)
SODIUM: 138 mmol/L (ref 135–145)

## 2018-04-05 LAB — GLUCOSE, CAPILLARY: Glucose-Capillary: 101 mg/dL — ABNORMAL HIGH (ref 70–99)

## 2018-04-05 MED ORDER — ENSURE ENLIVE PO LIQD
237.0000 mL | Freq: Three times a day (TID) | ORAL | Status: DC
  Administered 2018-04-05 – 2018-04-07 (×4): 237 mL via ORAL

## 2018-04-05 MED ORDER — ACETAMINOPHEN 325 MG PO TABS: 325.0000 mg | ORAL_TABLET | Freq: Three times a day (TID) | ORAL | Status: DC | PRN

## 2018-04-05 MED ORDER — ENOXAPARIN SODIUM 40 MG/0.4ML ~~LOC~~ SOLN
40.0000 mg | SUBCUTANEOUS | Status: DC
  Administered 2018-04-05 – 2018-04-06 (×2): 40 mg via SUBCUTANEOUS
  Filled 2018-04-05 (×2): qty 0.4

## 2018-04-05 NOTE — Progress Notes (Signed)
Family Medicine Teaching Service Daily Progress Note Intern Pager: [phone]  Patient name: [name] [name] Medical record number: [id number] Date of birth: 1990/07/04 Age: 27 y.o. Gender: male  Primary Care Provider: Salley Scarlet, MD Consultants: None Code Status: Full code  Pt Overview and Major Events to Date:  04/02/2018 Admitted Hospital Day: 4   Assessment and Plan: [name] [name] is a 27 y.o. male who presented with right sided chest pain found to have right middle lobe pneumonia. PMHx is significant for Cystic fibrosis, anxiety, and GERD.  Right-middle lobe pneumonia, stable Improving overnight. On room air and subjectively breathing easier. RML PNA on CTA likely 2/2 chronic CF. IV vanc and cefepime since 10/18.  Afebrile, improving leukocytosis.  White blood cell count from 14.8->11.2.  Sputum Gram stain showing few gram-positive cocci, moderate gram-negative rods, few gram-positive rods.  Culture still pending.  Will await culture data before switching to oral and more narrow spectrum antibiotics.  Blood cultures no growth to 3 days.  Legionella negative. - Vital signs per floor routine - Follow-up sputum culture and sensitivities - Continue Albuterol q2 PRN - Continue Dulera BID - Follow up on am CBC and BMP  - Incentive spirometry - Continue chest physiotherapy - Continuous pulse ox w/ O2 as needed - Consider Tamiflu for viral prophylaxis  Chest pain Likely secondary to known right-sided pneumonia.  Been receiving Toradol and Tylenol number burst 3 with improvement.  Will continue with nonsteroidal and semi-management.  Day 3 of 5 of Toradol. -Continue Toradol 30 mg every 6 hours (day 3 of 5) -Acetaminophen 325 mg every 6 hours - Tylenol 3 300/30 every 6 hours as needed  Cystic Fibrosis I Chronic Liver disease I Pancreatic Insufficiency, chronic, stable On home Creon TID, Albuterol, MTVI, Pulmicort neb, Symbicort inhaler, Azithromycin 500 mg (MWF), Ursodiol,  Levocetirizine, Tezacaftor-Ivacaftor. Followed by [facility] with last appt Nov. 2018 and upcoming appt in 04/08/18. A1c 5.8. - Continue MTVI's and enzyme replacement therapy - Holding home azithromycin - Continue tezacaftor-Ivacaftor  Hemoptysis I Epistaxis I Anemia, chronic Hemoglobin stable at 10.2.  Baseline 12. Hbg 10.4> 10.2.  Due to stable hemoglobin is we will start Lovenox for DVT prophylaxis.  DC SCDs. - Follow up on am CBC --Outpatient follow up with ENT for perforated septum  -Lovenox 40 mg daily for DVT prophylaxis  Hypokalemia K+ 4.1 this morning. --Replete as needed --Daily BMP  Chronic low back pain: stable, mild Associated with chronic cough and posture. Treats with Naproxen 500mg .  - K-pad -Toradol and Tylenol/Tylenol No. 3 as above  Severe Protein-calorie malnutrition, chronic Albumin on admission was 2.4, supplements with Boost with meals TID. - Regular diet - Continue Boost supplementation TID w/ meals - Continue home MTVI - Nutrition consulted, will follow up  Anxiety: chronic, stable Has taken zoloft in the past. No longer taking. Denies any current anxiety. - continue to monitor  GERD: chronic, stable - Continue home Protonix 40mg  BID  Fluids: Saline lock Electrolytes: Replace PRN Nutrition: Regular diet with boost supplementation  GI ppx: Protonix  DVT ppx: lovenox  Disposition: home pending improvement  Subjective:  Improved slightly today.  Has had remarkable improvement since admission per his report this a.m.  Feeling very sleepy  Objective: Vital Signs Temp:  [97.6 F (36.4 C)-99.3 F (37.4 C)] 98.2 F (36.8 C) (10/21 0947) Pulse Rate:  [59-103] 87 (10/21 0947) Resp:  [18] 18 (10/21 0947) BP: (84-101)/(51-76) 93/71 (10/21 0947) SpO2:  [95 %-100 %] 98 % (10/21 0947)  Intake/Output No intake/output data recorded.  Physical Exam:  Gen: 27 year old Caucasian male, resting comfortably in bed. Skin: Warm and dry. No obvious rashes,  lesions, or trauma, warm to palpation with minimal perspiration throughout. HEENT: NCAT No conjunctival pallor or injection. No scleral icterus or injection.  MMM. 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 <2s capillary refill bilaterally.  RP & DPs 2+ bilaterally. No BLEE. Resp: Minimal change to lung exam from earlier in night.  No increased WOB on room air, non-productive cough present Abd: NTND on palpation to all 4 quadrants.  Positive bowel sounds. Psych: Behavior and mood appropriate, somewhat flat affect Extremities: Moves all extremities spontaneously   Laboratory: Recent Labs  Lab 04/03/18 0938 04/04/18 0323 04/05/18 0542  WBC 18.2* 14.8* 11.2*  HGB 11.2* 10.4* 10.2*  HCT 35.0* 32.8* 32.9*  PLT 451* 448* 429*   Recent Labs  Lab 04/03/18 0938 04/04/18 0323 04/05/18 0542  NA 137 135 138  K 4.2 3.3* 4.1  CL 102 103 109  CO2 24 25 22   BUN 5* 6 7  CREATININE 0.75 0.68 0.67  CALCIUM 8.4* 8.3* 8.2*  PROT 6.9  --   --   BILITOT 0.6  --   --   ALKPHOS 135*  --   --   ALT 12  --   --   AST 14*  --   --   GLUCOSE 125* 126* 115*   Imaging/Diagnostic Tests: No results found.  Myrene Buddy MD PGY-2 Family Medicine Resident 04/05/2018, 12:30 PM FPTS Intern pager: [phone], text pages welcome

## 2018-04-05 NOTE — Progress Notes (Signed)
Initial Nutrition Assessment  DOCUMENTATION CODES:   Underweight, Severe malnutrition in context of chronic illness  INTERVENTION:   - Ensure Enlive po TID, each supplement provides 350 kcal and 20 grams of protein (chocolate flavor)  - Continue MVI with minerals daily  - d/c Boost Breeze  NUTRITION DIAGNOSIS:   Severe Malnutrition related to chronic illness (cystic fibrosis) as evidenced by severe fat depletion, severe muscle depletion.  GOAL:   Patient will meet greater than or equal to 90% of their needs  MONITOR:   PO intake, Supplement acceptance, Weight trends, Labs  REASON FOR ASSESSMENT:   Consult Assessment of nutrition requirement/status  ASSESSMENT:   27 year old male who presented to the ED on 10/19 with chest pain. PMH significant for cystic fibrosis, GERD, and anxiety. Pt found to have right middle lobe PNA.  RN in room providing nursing care at time of visit.  Pt with flat affect throughout RD interview and did not provide many details despite prompting.  Pt reports that he has had "nothing" to eat for 3 days PTA due to extreme pain. Pt reports that during this time, he only drank water.  Pt states that he usually has a good appetite and "eats everything." Per pt, he has more than 3 meals daily. Pt not very forthcoming regarding what he may eat at a meal. Pt states that he eats "steak and whatever I find appetizing" in the morning around 11:00 am. Pt states that he eats several more times throughout the day and that these are "full-on meals." Pt shares that he drinks water, sodas, tea, and coffee as well.  Pt reports drinking regular Boost TID with meals (chocolate). Pt does not like Boost Breeze. RD to d/c orders and order Ensure Enlive TID with meals. Pt states that he does not like Ensure Enlive as much as Boost but is willing to drink it during admission.  Pt declines RD ordering snacks, states, "I have plenty here." Noted bags of chips and pretzels at  bedside.  Pt is unsure whether he has lost weight recently and reports his UBW as 130 lbs. Pt states that he follows up with an RD at [facility] "occasionally" when he is there for MD visits. Weight history in chart is limited as last recorded weight PTA is from 02/13/16 (61.7 kg). Weight measured on 04/02/18 was 53.8 kg. Suspect pt has had significant weight loss over the past 1 year but unable to quantify.  Meal Completion: 100%  Medications reviewed and include: Tums TID, vitamin D 3000 units daily, Boost Breeze TID, MVI with minerals daily, Protonix 40 mg daily, vitamin C 500 mg daily, IV antibiotics  Labs reviewed.  NUTRITION - FOCUSED PHYSICAL EXAM:    Most Recent Value  Orbital Region  Moderate depletion  Upper Arm Region  Severe depletion  Thoracic and Lumbar Region  Severe depletion  Buccal Region  Severe depletion  Temple Region  Moderate depletion  Clavicle Bone Region  Severe depletion  Clavicle and Acromion Bone Region  Severe depletion  Scapular Bone Region  Severe depletion  Dorsal Hand  Moderate depletion  Patellar Region  Severe depletion  Anterior Thigh Region  Severe depletion  Posterior Calf Region  Severe depletion  Edema (RD Assessment)  None  Hair  Reviewed  Eyes  Reviewed  Mouth  Reviewed  Skin  Reviewed  Nails  Reviewed       Diet Order:   Diet Order            Diet  regular Room service appropriate? Yes; Fluid consistency: Thin  Diet effective now              EDUCATION NEEDS:   No education needs have been identified at this time  Skin:  Skin Assessment: Reviewed RN Assessment  Last BM:  10/20  Height:   Ht Readings from Last 1 Encounters:  04/02/18 5\' 9"  (1.753 m)    Weight:   Wt Readings from Last 1 Encounters:  04/02/18 53.8 kg    Ideal Body Weight:  72.73 kg  BMI:  Body mass index is 17.52 kg/m.  Estimated Nutritional Needs:   Kcal:  2000-2200  Protein:  75-90 grams  Fluid:  >/= 2.0 L    Earma Reading, MS,  RD, LDN Inpatient Clinical Dietitian Pager: [phone] Weekend/After Hours: [phone]

## 2018-04-06 LAB — CBC WITH DIFFERENTIAL/PLATELET
Abs Immature Granulocytes: 0.06 10*3/uL (ref 0.00–0.07)
BASOS ABS: 0 10*3/uL (ref 0.0–0.1)
Basophils Relative: 0 %
EOS ABS: 0.5 10*3/uL (ref 0.0–0.5)
EOS PCT: 4 %
HEMATOCRIT: 32.3 % — AB (ref 39.0–52.0)
HEMOGLOBIN: 10.1 g/dL — AB (ref 13.0–17.0)
Immature Granulocytes: 1 %
LYMPHS ABS: 2 10*3/uL (ref 0.7–4.0)
LYMPHS PCT: 17 %
MCH: 27.9 pg (ref 26.0–34.0)
MCHC: 31.3 g/dL (ref 30.0–36.0)
MCV: 89.2 fL (ref 80.0–100.0)
MONO ABS: 0.9 10*3/uL (ref 0.1–1.0)
Monocytes Relative: 8 %
NRBC: 0 % (ref 0.0–0.2)
Neutro Abs: 8.3 10*3/uL — ABNORMAL HIGH (ref 1.7–7.7)
Neutrophils Relative %: 70 %
Platelets: 423 10*3/uL — ABNORMAL HIGH (ref 150–400)
RBC: 3.62 MIL/uL — ABNORMAL LOW (ref 4.22–5.81)
RDW: 13.2 % (ref 11.5–15.5)
WBC: 11.7 10*3/uL — AB (ref 4.0–10.5)

## 2018-04-06 LAB — BASIC METABOLIC PANEL
Anion gap: 7 (ref 5–15)
BUN: 7 mg/dL (ref 6–20)
CALCIUM: 8.3 mg/dL — AB (ref 8.9–10.3)
CHLORIDE: 106 mmol/L (ref 98–111)
CO2: 22 mmol/L (ref 22–32)
CREATININE: 0.72 mg/dL (ref 0.61–1.24)
GFR calc non Af Amer: >60 mL/min (ref >60)
GLUCOSE: 119 mg/dL — AB (ref 70–99)
Potassium: 4 mmol/L (ref 3.5–5.1)
Sodium: 135 mmol/L (ref 135–145)

## 2018-04-06 MED ORDER — LEVOFLOXACIN 750 MG PO TABS
750.0000 mg | ORAL_TABLET | Freq: Every day | ORAL | Status: DC
  Administered 2018-04-06 – 2018-04-07 (×2): 750 mg via ORAL
  Filled 2018-04-06 (×2): qty 1

## 2018-04-06 NOTE — Progress Notes (Signed)
Patient returned from OR. AAOx4, pain 0/10, VSS. Begin clear liquid diet. Bed in low position, call bell in reach

## 2018-04-06 NOTE — Plan of Care (Signed)
  Problem: Education: Goal: Knowledge of General Education information will improve Description Including pain rating scale, medication(s)/side effects and non-pharmacologic comfort measures Outcome: Progressing   Problem: Activity: Goal: Ability to tolerate increased activity will improve Outcome: Progressing   Problem: Clinical Measurements: Goal: Ability to maintain a body temperature in the normal range will improve Outcome: Progressing   Problem: Respiratory: Goal: Ability to maintain adequate ventilation will improve Outcome: Progressing Goal: Ability to maintain a clear airway will improve Outcome: Progressing   Problem: Health Behavior/Discharge Planning: Goal: Ability to manage health-related needs will improve Outcome: Progressing   Problem: Clinical Measurements: Goal: Ability to maintain clinical measurements within normal limits will improve Outcome: Progressing Goal: Will remain free from infection Outcome: Progressing Goal: Diagnostic test results will improve Outcome: Progressing Goal: Respiratory complications will improve Outcome: Progressing Goal: Cardiovascular complication will be avoided Outcome: Progressing   Problem: Activity: Goal: Risk for activity intolerance will decrease Outcome: Progressing   Problem: Nutrition: Goal: Adequate nutrition will be maintained Outcome: Progressing   Problem: Coping: Goal: Level of anxiety will decrease Outcome: Progressing   Problem: Elimination: Goal: Will not experience complications related to bowel motility Outcome: Progressing Goal: Will not experience complications related to urinary retention Outcome: Progressing   Problem: Pain Managment: Goal: General experience of comfort will improve Outcome: Progressing   Problem: Safety: Goal: Ability to remain free from injury will improve Outcome: Progressing   Problem: Skin Integrity: Goal: Risk for impaired skin integrity will decrease Outcome:  Progressing

## 2018-04-06 NOTE — Progress Notes (Signed)
Family Medicine Teaching Service Daily Progress Note Intern Pager: [phone]  Patient name: [name] [name] Medical record number: [id number] Date of birth: [birth year] Age: 27 y.o. Gender: male  Primary Care Provider: Salley Scarlet, MD Consultants: None Code Status: Full code  Pt Overview and Major Events to Date:  04/02/2018 Admitted Hospital Day: 6   Assessment and Plan: [name] [name] is a 27 y.o. male who presented with right sided chest pain found to have right middle lobe pneumonia. PMHx is significant for Cystic fibrosis, anxiety, and GERD.  Right-middle lobe pneumonia in CF patient, stable Blood Cx NGTD.  Remains afebrile and on RA.  WBC 10.8. Sputum Cx - Few Gram Negative Rods, susceptibilities still pending. - Vital signs per floor routine - changed to Levaquin (10/22- ) - Follow-up sputum culture identification and sensitivities to narrow abx tx - Continue Albuterol q2 PRN - Continue Dulera BID - Incentive spirometry - Continue chest physiotherapy - Continuous pulse ox w/ O2 as needed  Right sided Chest pain Likely 2/2 known RML pneumonia.  Improved with Tylenol #3 and Toradol.  Received Toradol x1 last Pm and Tylenol #3 x2 on 10/22.  -Continue Toradol 30 mg every 6 hours (day 5 of 5) -Acetaminophen 325 mg every 6 hours -Tylenol #3 300/30 every 6 hours as needed, will d/c home with Rx  Cystic Fibrosis I Chronic Liver disease I Pancreatic Insufficiency, chronic, stable On home Creon TID, Albuterol, MTVI, Pulmicort neb, Symbicort inhaler, Azithromycin 500 mg (MWF), Ursodiol, Levocetirizine, Tezacaftor-Ivacaftor. Followed by [facility] with last appt Nov. 2018 and upcoming appt in 04/08/18. - Continue MTVI's and enzyme replacement therapy - Holding home azithromycin - Continue tezacaftor-Ivacaftor  Hemoptysis I Epistaxis I Anemia, chronic Hgb 9.9, stable. -monitor CBC -Outpatient follow up with ENT for perforated septum   Chronic low back pain: stable, mild Associated  with chronic cough and posture. Treats with Naproxen 500mg .  - K-pad -Toradol and Tylenol/Tylenol No. 3 as above  Severe Protein-calorie malnutrition, chronic Albumin on admission was 2.4, supplements with Boost with meals TID. - Regular diet - Continue Boost supplementation TID w/ meals - Continue home MTVI - Nutrition consulted, will follow up  Anxiety: chronic, stable Has taken zoloft in the past. No longer taking. Denies any current anxiety. - continue to monitor  GERD: chronic, stable - Continue home Protonix 40mg  BID  Fluids: Saline lock Electrolytes: Replace PRN Nutrition: Regular diet with boost supplementation  GI ppx: Protonix  DVT ppx: lovenox  Disposition: home today, [facility] pulm appt 10/24  Subjective:  Patient notes that he is feeling well this AM.  Continues to have chest pain, but is much improved.  Denies any new complaints.  Objective: Vital Signs Temp:  [97.7 F (36.5 C)-98.6 F (37 C)] 97.7 F (36.5 C) (10/23 0530) Pulse Rate:  [66-85] 68 (10/23 0538) Resp:  [16-20] 20 (10/23 0530) BP: (82-96)/(46-69) 96/69 (10/23 0538) SpO2:  [97 %-98 %] 97 % (10/23 0530)  Intake/Output 10/22 0701 - 10/23 0700 In: 200 [P.O.:200] Out: -   Physical Exam:  General: 27 y.o. male in NAD Cardio: RRR no m/r/g Lungs: CTAB, no wheezing, no rhonchi, no crackles, no increased WOB Abdomen: Soft, non-tender to palpation, positive bowel sounds Skin: warm and dry Extremities: No edema, thin     Laboratory: Recent Labs  Lab 04/05/18 0542 04/06/18 0345 04/07/18 0407  WBC 11.2* 11.7* 10.8*  HGB 10.2* 10.1* 9.9*  HCT 32.9* 32.3* 33.4*  PLT 429* 423* 499*   Recent Labs  Lab 04/03/18  1610  04/05/18 0542 04/06/18 0345 04/07/18 0407  NA 137   < > 138 135 138  K 4.2   < > 4.1 4.0 4.1  CL 102   < > 109 106 105  CO2 24   < > 22 22 24   BUN 5*   < > 7 7 8   CREATININE 0.75   < > 0.67 0.72 0.80  CALCIUM 8.4*   < > 8.2* 8.3* 8.3*  PROT 6.9  --   --   --   --    BILITOT 0.6  --   --   --   --   ALKPHOS 135*  --   --   --   --   ALT 12  --   --   --   --   AST 14*  --   --   --   --   GLUCOSE 125*   < > 115* 119* 96   < > = values in this interval not displayed.   Imaging/Diagnostic Tests: Dg Chest 2 View IMPRESSION: New right middle lobe airspace opacity is noted consistent with pneumonia. Stable scarring is seen throughout both lungs. Electronically Signed   By: Lupita Raider, M.D.   On: 04/02/2018 18:12   Ct Angio Chest Pe W And/or Wo Contrast IMPRESSION: No definite evidence of pulmonary embolus. Large right middle lobe airspace opacity is noted consistent with pneumonia. Bronchiectasis is noted throughout both lungs with nodular densities of varying sizes throughout both lungs, which most likely represent scarring or chronic inflammation an are related to history of cystic fibrosis. Enlarged mediastinal adenopathy is noted which most likely is inflammatory in etiology. Electronically Signed   By: Lupita Raider, M.D.   On: 04/02/2018 20:40     Luis Abed, D.O.  PGY-1 Family Medicine  04/07/2018 7:27 AM

## 2018-04-06 NOTE — Progress Notes (Signed)
Family Medicine Teaching Service Daily Progress Note Intern Pager: [phone]  Patient name: [name] [name] Medical record number: [id number] Date of birth: 01-Jul-1990 Age: 27 y.o. Gender: male  Primary Care Provider: Salley Scarlet, MD Consultants: None Code Status: Full code  Pt Overview and Major Events to Date:  04/02/2018 Admitted Hospital Day: 5   Assessment and Plan: [name] [name] is a 27 y.o. male who presented with right sided chest pain found to have right middle lobe pneumonia. PMHx is significant for Cystic fibrosis, anxiety, and GERD.  Right-middle lobe pneumonia in CF patient, stable Afebrile, improving leukocytosis.  WBC this am 11.7. Blood cx NGTD, legtionella and strep pneum testing negative - Vital signs per floor routine - Follow-up sputum culture and sensitivities to narrow abx tx - Continue Albuterol q2 PRN - Continue Dulera BID - Incentive spirometry - Continue chest physiotherapy - Continuous pulse ox w/ O2 as needed  Right sided Chest pain Likely secondary to known right-sided pneumonia.  Been receiving Toradol and Tylenol #3 with improvement. -Continue Toradol 30 mg every 6 hours (day 4 of 5) -Acetaminophen 325 mg every 6 hours -Tylenol #3 300/30 every 6 hours as needed  Cystic Fibrosis I Chronic Liver disease I Pancreatic Insufficiency, chronic, stable On home Creon TID, Albuterol, MTVI, Pulmicort neb, Symbicort inhaler, Azithromycin 500 mg (MWF), Ursodiol, Levocetirizine, Tezacaftor-Ivacaftor. Followed by [facility] with last appt Nov. 2018 and upcoming appt in 04/08/18. - Continue MTVI's and enzyme replacement therapy - Holding home azithromycin - Continue tezacaftor-Ivacaftor  Hemoptysis I Epistaxis I Anemia, chronic Hemoglobin stable at 10.1 -monitor CBC -Outpatient follow up with ENT for perforated septum   Hypokalemia- resolved, K 4.0 this am  -Daily BMP  Chronic low back pain: stable, mild Associated with chronic cough and posture. Treats  with Naproxen 500mg .  - K-pad -Toradol and Tylenol/Tylenol No. 3 as above  Severe Protein-calorie malnutrition, chronic Albumin on admission was 2.4, supplements with Boost with meals TID. - Regular diet - Continue Boost supplementation TID w/ meals - Continue home MTVI - Nutrition consulted, will follow up  Anxiety: chronic, stable Has taken zoloft in the past. No longer taking. Denies any current anxiety. - continue to monitor  GERD: chronic, stable - Continue home Protonix 40mg  BID  Fluids: Saline lock Electrolytes: Replace PRN Nutrition: Regular diet with boost supplementation  GI ppx: Protonix  DVT ppx: lovenox  Disposition: home pending improvement, [facility] pulm appt 10/24  Subjective:  Reports no fevers, breathing comfortably, occasional productive cough.   Objective: Vital Signs Temp:  [98.2 F (36.8 C)-98.9 F (37.2 C)] 98.9 F (37.2 C) (10/21 2222) Pulse Rate:  [67-87] 67 (10/22 0628) Resp:  [17-18] 17 (10/22 0628) BP: (89-93)/(64-71) 91/70 (10/22 0628) SpO2:  [95 %-98 %] 98 % (10/22 0628)  Intake/Output 10/21 0701 - 10/22 0700 In: 1357.8 [P.O.:200; IV Piggyback:1157.8] Out: -   Physical Exam:   Gen: thin caucasian male in NAD Heart: RRR no MRG Lungs: CTA bilaterally, no increased work of breathing Abdomen: soft, non-tender, non-distended, +BS Extremities: thin extremities, no edema or cyanosis Neuro: no focal deficits   Laboratory: Recent Labs  Lab 04/04/18 0323 04/05/18 0542 04/06/18 0345  WBC 14.8* 11.2* 11.7*  HGB 10.4* 10.2* 10.1*  HCT 32.8* 32.9* 32.3*  PLT 448* 429* 423*   Recent Labs  Lab 04/03/18 0938 04/04/18 0323 04/05/18 0542 04/06/18 0345  NA 137 135 138 135  K 4.2 3.3* 4.1 4.0  CL 102 103 109 106  CO2 24 25 22  22  BUN 5* 6 7 7   CREATININE 0.75 0.68 0.67 0.72  CALCIUM 8.4* 8.3* 8.2* 8.3*  PROT 6.9  --   --   --   BILITOT 0.6  --   --   --   ALKPHOS 135*  --   --   --   ALT 12  --   --   --   AST 14*  --   --   --    GLUCOSE 125* 126* 115* 119*   Imaging/Diagnostic Tests: Dg Chest 2 View IMPRESSION: New right middle lobe airspace opacity is noted consistent with pneumonia. Stable scarring is seen throughout both lungs. Electronically Signed   By: Lupita Raider, M.D.   On: 04/02/2018 18:12   Ct Angio Chest Pe W And/or Wo Contrast IMPRESSION: No definite evidence of pulmonary embolus. Large right middle lobe airspace opacity is noted consistent with pneumonia. Bronchiectasis is noted throughout both lungs with nodular densities of varying sizes throughout both lungs, which most likely represent scarring or chronic inflammation an are related to history of cystic fibrosis. Enlarged mediastinal adenopathy is noted which most likely is inflammatory in etiology. Electronically Signed   By: Lupita Raider, M.D.   On: 04/02/2018 20:40     Dolores Patty, DO PGY-3, [place] [facility] 04/06/2018 8:16 AM

## 2018-04-07 LAB — CBC
HCT: 33.4 % — ABNORMAL LOW (ref 39.0–52.0)
Hemoglobin: 9.9 g/dL — ABNORMAL LOW (ref 13.0–17.0)
MCH: 26.6 pg (ref 26.0–34.0)
MCHC: 29.6 g/dL — AB (ref 30.0–36.0)
MCV: 89.8 fL (ref 80.0–100.0)
PLATELETS: 499 10*3/uL — AB (ref 150–400)
RBC: 3.72 MIL/uL — ABNORMAL LOW (ref 4.22–5.81)
RDW: 13.1 % (ref 11.5–15.5)
WBC: 10.8 10*3/uL — ABNORMAL HIGH (ref 4.0–10.5)
nRBC: 0 % (ref 0.0–0.2)

## 2018-04-07 LAB — CULTURE, BLOOD (ROUTINE X 2)
Culture: NO GROWTH
Culture: NO GROWTH
Special Requests: ADEQUATE
Special Requests: ADEQUATE

## 2018-04-07 LAB — BASIC METABOLIC PANEL
Anion gap: 9 (ref 5–15)
BUN: 8 mg/dL (ref 6–20)
CALCIUM: 8.3 mg/dL — AB (ref 8.9–10.3)
CO2: 24 mmol/L (ref 22–32)
Chloride: 105 mmol/L (ref 98–111)
Creatinine, Ser: 0.8 mg/dL (ref 0.61–1.24)
GFR calc Af Amer: >60 mL/min (ref >60)
GLUCOSE: 96 mg/dL (ref 70–99)
Potassium: 4.1 mmol/L (ref 3.5–5.1)
Sodium: 138 mmol/L (ref 135–145)

## 2018-04-07 MED ORDER — ACETAMINOPHEN-CODEINE #3 300-30 MG PO TABS: 1.0000 | ORAL_TABLET | Freq: Four times a day (QID) | ORAL | 0 refills | Status: DC | PRN

## 2018-04-07 MED ORDER — LEVOFLOXACIN 750 MG PO TABS: 750.0000 mg | ORAL_TABLET | Freq: Every day | ORAL | 0 refills | Status: DC

## 2018-04-07 NOTE — Progress Notes (Signed)
Patient refused to use the flutter. He said he would be going home this evening.

## 2018-04-07 NOTE — Discharge Instructions (Signed)
Please keep your follow up appointment with your cystic fibrosis doctor You are being discharged with around a 3 week supply of tylenol #3, your chest pain should improve as the pneumonia clears up You are being discharged on an oral antibiotic called levaquin, please take this one time per day for 5 days to complete your pneumonia treatment course.

## 2018-04-07 NOTE — Discharge Summary (Addendum)
Family Medicine Teaching [facility] Discharge Summary  Patient name: [name] [name] Medical record number: [id number] Date of birth: 10/24/1990 Age: 27 y.o. Gender: male Date of Admission: 04/02/2018  Date of Discharge: 04/07/2018 Admitting Physician: Joana Reamer, DO  Primary Care Provider: Salley Scarlet, MD Consultants: none  Indication for Hospitalization: Right middle lobe pneumonia  Discharge Diagnoses/Problem List:  Right middle lobe pneumonia Right-sided chest pain Cystic fibrosis Chronic low back pain Severe protein calorie malnutrition Epistaxis  Disposition: home  Discharge Condition: stable  Discharge Exam:  General: 27 y.o. male in NAD Cardio: RRR no m/r/g Lungs: CTAB, no wheezing, no rhonchi, no crackles, no increased WOB Abdomen: Soft, non-tender to palpation, positive bowel sounds Skin: warm and dry Extremities: No edema, thin  Brief Hospital Course:  27 year old male with known cystic fibrosis presented on 10/18 with right middle lobe pneumonia.  Patient initially presented with right-sided chest pain.  This prompted a work-up for ACS, pulmonary embolus. These were effectively ruled out with EKG, negative troponins, and CTA.  Patient found to have a right middle lobe pneumonia, white blood cell count 20, and very mild subjective shortness of breath.  He was started on vancomycin and cefepime at time of admission.    Right middle lobe pneumonia Patient had gradual improvement in symptoms of subjective shortness of breath and chest pain while on Vanco and cefepime.  Sputum culture eventually grew out few pseudomonas aeruginosa, susceptibility still pending at time of discharge.  On 1022 patient was switched to Levaquin 75 mg daily.  He was discharged to complete a 5-day course of Levaquin, to add on to the 4-day course of Vanco and cefepime.  He will follow-up with his outpatient cystic fibrosis Dr. on 04/08/2018.  Chest pain Patient's chest pain was  felt to be all pleuritic due to his known pneumonia.  He was to evaluate 5-day course of Toradol during his admission, along with Tylenol 3.  This did help his pain significantly.  He was discharged with a 3-week course of Tylenol No. 3.  Cystic fibrosis Patient's home meds were continued during admission.  He takes Creon, albuterol, Pulmicort, Symbicort, azithromycin,, ursodiol, levocetirizine, tezacaftor-ifacaftor.  Severe protein calorie malnutrition Albumin on admission 2.4.  His meals were supplement with boost and multivitamin.   Issues for Follow Up:  1. Follow-up with cystic fibrosis doctor for further management of CF as outpatient 2. Ensure patient completes 5-day course of Levaquin, and has resolution of symptoms  Significant Procedures:   Significant Labs and Imaging:  Recent Labs  Lab 04/05/18 0542 04/06/18 0345 04/07/18 0407  WBC 11.2* 11.7* 10.8*  HGB 10.2* 10.1* 9.9*  HCT 32.9* 32.3* 33.4*  PLT 429* 423* 499*   Recent Labs  Lab 04/03/18 0938 04/04/18 0323 04/05/18 0542 04/06/18 0345 04/07/18 0407  NA 137 135 138 135 138  K 4.2 3.3* 4.1 4.0 4.1  CL 102 103 109 106 105  CO2 24 25 22 22 24   GLUCOSE 125* 126* 115* 119* 96  BUN 5* 6 7 7 8   CREATININE 0.75 0.68 0.67 0.72 0.80  CALCIUM 8.4* 8.3* 8.2* 8.3* 8.3*  ALKPHOS 135*  --   --   --   --   AST 14*  --   --   --   --   ALT 12  --   --   --   --   ALBUMIN 2.4*  --   --   --   --     Results/Tests Pending  at Time of Discharge:  Discharge Medications:  Allergies as of 04/07/2018      Reactions   Amoxapine And Related    Ondansetron Hives   Amoxicillin Hives, Other (See Comments), Rash   Gas   Ondansetron Hcl Rash   Penicillins Hives, Other (See Comments), Rash   Gas   Sulfa Antibiotics Rash      Medication List    STOP taking these medications   azithromycin 500 MG tablet Commonly known as:  ZITHROMAX     TAKE these medications   acetaminophen-codeine 300-30 MG tablet Commonly known as:   TYLENOL #3 Take 1 tablet by mouth every 6 (six) hours as needed for up to 60 doses for moderate pain (moderate pain or cough).   albuterol (2.5 MG/3ML) 0.083% nebulizer solution Commonly known as:  PROVENTIL Take 2.5 mg by nebulization every 6 (six) hours as needed for wheezing or shortness of breath.   albuterol 108 (90 Base) MCG/ACT inhaler Commonly known as:  PROVENTIL HFA;VENTOLIN HFA Inhale 2 puffs into the lungs every 4 (four) hours as needed. Wheezing   aspirin 325 MG tablet Take 325 mg by mouth every 6 (six) hours as needed. pain   budesonide 0.5 MG/2ML nebulizer solution Commonly known as:  PULMICORT Take 0.5 mg by nebulization 2 (two) times daily.   calcium gluconate 500 MG tablet Take 1 tablet by mouth 2 (two) times daily.   cholecalciferol 1000 units tablet Commonly known as:  VITAMIN D Take 3,000 Units by mouth daily.   levocetirizine 5 MG tablet Commonly known as:  XYZAL TAKE 1 TABLET BY MOUTH EVERY EVENING   levofloxacin 750 MG tablet Commonly known as:  LEVAQUIN Take 1 tablet (750 mg total) by mouth daily. Start taking on:  04/08/2018   multivitamins ther. w/minerals Tabs tablet Take 1 tablet by mouth daily.   pantoprazole 40 MG tablet Commonly known as:  PROTONIX TAKE ONE TABLET BY MOUTH TWICE DAILY   SYMBICORT 160-4.5 MCG/ACT inhaler Generic drug:  budesonide-formoterol Inhale 2 puffs into the lungs 2 (two) times daily.   Tezacaftor-Ivacaftor&Ivacaftor 100-150 & 150 MG Tbpk Take 150 mg by mouth 2 (two) times daily.   ursodiol 250 MG tablet Commonly known as:  ACTIGALL Take 1 tablet (250 mg total) by mouth 2 (two) times daily.   vitamin C 500 MG tablet Commonly known as:  ASCORBIC ACID Take 500 mg by mouth daily.       Discharge Instructions: Please refer to Patient Instructions section of EMR for full details.  Patient was counseled important signs and symptoms that should prompt return to medical care, changes in medications, dietary  instructions, activity restrictions, and follow up appointments.   Follow-Up Appointments:   Myrene Buddy, MD 04/07/2018, 2:54 PM PGY-2, [facility]

## 2018-04-08 DIAGNOSIS — Z79899 Other long term (current) drug therapy: Secondary | ICD-10-CM | POA: Diagnosis not present

## 2018-04-08 LAB — CULTURE, RESPIRATORY: SPECIAL REQUESTS: NORMAL

## 2018-04-08 LAB — CULTURE, RESPIRATORY W GRAM STAIN

## 2018-04-08 LAB — CULTURE, BLOOD (ROUTINE X 2)
Culture: NO GROWTH
Culture: NO GROWTH
Special Requests: ADEQUATE
Special Requests: ADEQUATE

## 2018-04-09 ENCOUNTER — Telehealth: Payer: Self-pay | Admitting: Family Medicine

## 2018-04-09 NOTE — Telephone Encounter (Signed)
Called and left message for patient's pulmonologist regarding sputum culture results. Sensitivity to ciprofloxacin came back as intermediate. Patient had seen them in clinic 10/24 and was instructed to continue taking this medication. They can view these results in care everywhere tab.  Dolores Patty, DO PGY-3, [facility] 04/09/2018 11:55 AM

## 2018-04-28 DIAGNOSIS — F321 Major depressive disorder, single episode, moderate: Secondary | ICD-10-CM | POA: Diagnosis not present

## 2018-06-17 DIAGNOSIS — J329 Chronic sinusitis, unspecified: Secondary | ICD-10-CM | POA: Diagnosis not present

## 2018-06-17 DIAGNOSIS — K8689 Other specified diseases of pancreas: Secondary | ICD-10-CM | POA: Diagnosis not present

## 2018-06-17 DIAGNOSIS — F329 Major depressive disorder, single episode, unspecified: Secondary | ICD-10-CM | POA: Diagnosis not present

## 2018-06-17 DIAGNOSIS — Z882 Allergy status to sulfonamides status: Secondary | ICD-10-CM | POA: Diagnosis not present

## 2018-06-17 DIAGNOSIS — Z79899 Other long term (current) drug therapy: Secondary | ICD-10-CM | POA: Diagnosis not present

## 2018-06-17 DIAGNOSIS — F419 Anxiety disorder, unspecified: Secondary | ICD-10-CM | POA: Diagnosis not present

## 2018-06-17 DIAGNOSIS — Z880 Allergy status to penicillin: Secondary | ICD-10-CM | POA: Diagnosis not present

## 2018-06-17 DIAGNOSIS — K219 Gastro-esophageal reflux disease without esophagitis: Secondary | ICD-10-CM | POA: Diagnosis not present

## 2018-06-28 DIAGNOSIS — F321 Major depressive disorder, single episode, moderate: Secondary | ICD-10-CM | POA: Diagnosis not present

## 2018-07-19 DIAGNOSIS — F321 Major depressive disorder, single episode, moderate: Secondary | ICD-10-CM | POA: Diagnosis not present

## 2018-08-12 DIAGNOSIS — M8589 Other specified disorders of bone density and structure, multiple sites: Secondary | ICD-10-CM | POA: Diagnosis not present

## 2018-09-08 DIAGNOSIS — F321 Major depressive disorder, single episode, moderate: Secondary | ICD-10-CM | POA: Diagnosis not present

## 2018-09-21 DIAGNOSIS — F321 Major depressive disorder, single episode, moderate: Secondary | ICD-10-CM | POA: Diagnosis not present

## 2018-10-01 DIAGNOSIS — F321 Major depressive disorder, single episode, moderate: Secondary | ICD-10-CM | POA: Diagnosis not present

## 2018-10-15 DIAGNOSIS — F321 Major depressive disorder, single episode, moderate: Secondary | ICD-10-CM | POA: Diagnosis not present

## 2018-10-25 DIAGNOSIS — F321 Major depressive disorder, single episode, moderate: Secondary | ICD-10-CM | POA: Diagnosis not present

## 2018-11-09 DIAGNOSIS — F321 Major depressive disorder, single episode, moderate: Secondary | ICD-10-CM | POA: Diagnosis not present

## 2018-11-25 DIAGNOSIS — M8580 Other specified disorders of bone density and structure, unspecified site: Secondary | ICD-10-CM | POA: Diagnosis not present

## 2018-11-25 DIAGNOSIS — Z87891 Personal history of nicotine dependence: Secondary | ICD-10-CM | POA: Diagnosis not present

## 2018-11-25 DIAGNOSIS — F419 Anxiety disorder, unspecified: Secondary | ICD-10-CM | POA: Diagnosis not present

## 2019-03-29 ENCOUNTER — Encounter: Payer: Self-pay | Admitting: Family Medicine

## 2019-03-29 ENCOUNTER — Other Ambulatory Visit: Payer: Self-pay

## 2019-03-29 ENCOUNTER — Ambulatory Visit (INDEPENDENT_AMBULATORY_CARE_PROVIDER_SITE_OTHER): Payer: Medicaid Other | Admitting: Family Medicine

## 2019-03-29 ENCOUNTER — Ambulatory Visit
Admission: RE | Admit: 2019-03-29 | Discharge: 2019-03-29 | Disposition: A | Discharge status: Home / Self Care | Payer: Medicaid Other | Source: Ambulatory Visit | Attending: Family Medicine | Admitting: Family Medicine

## 2019-03-29 VITALS — BP 108/64 | HR 67 | Temp 98.3°F | Resp 17 | Ht 67.0 in | Wt 123.2 lb

## 2019-03-29 DIAGNOSIS — M5442 Lumbago with sciatica, left side: Secondary | ICD-10-CM

## 2019-03-29 DIAGNOSIS — G8929 Other chronic pain: Secondary | ICD-10-CM

## 2019-03-29 DIAGNOSIS — E43 Unspecified severe protein-calorie malnutrition: Secondary | ICD-10-CM

## 2019-03-29 DIAGNOSIS — M549 Dorsalgia, unspecified: Secondary | ICD-10-CM | POA: Diagnosis not present

## 2019-03-29 DIAGNOSIS — M25562 Pain in left knee: Secondary | ICD-10-CM | POA: Diagnosis not present

## 2019-03-29 DIAGNOSIS — M545 Low back pain: Secondary | ICD-10-CM | POA: Diagnosis not present

## 2019-03-29 NOTE — Assessment & Plan Note (Signed)
Fairly complicated cystic fibrosis with multiple infections and hospitalizations.  He has chronic chest wall pain from his coughing and illness.  He has protein calorie malnutrition which also occurs with his cystic fibrosis.  For his chronic back pain knee pain will obtain repeat  x-rays.  He has never had any imaging of the knee.  I think that he needs to be in a pain management clinic.  States that he has discussed this at [facility] a few times but they never made the referral.

## 2019-03-29 NOTE — Patient Instructions (Addendum)
Get xray- [address]  Referral to pain clinic  F/U schedule a physical in 2-3 months

## 2019-03-29 NOTE — Progress Notes (Signed)
   Subjective:    Patient ID: [name] [name], male    DOB: March 23, 1991, 28 y.o.   MRN: [id number]  Patient presents for Knee Pain (Left knee pain)   Pt here with left knee pain for past 1-2 months. Worse when he crosses his knees. When pain initially started he just woke and stood up and fell and because knee gave out- 3 weeks ago   He has chronic back pain gets some tinglign in feet this is chronic , chest pain has pain all over He has Cystic Fibrosis followed at [facility] , has chronic pain in chest wall due to CF He has protein malnutrition as well  He has not had any swelling or redness Difficult with daily life due to the pain    [employer] for CF - newest experimental drug given to him   Review Of Systems:  GEN- denies fatigue, fever, weight loss,weakness, recent illness HEENT- denies eye drainage, change in vision, nasal discharge, CVS- denies chest pain, palpitations RESP- denies SOB, cough, wheeze ABD- denies N/V, change in stools, abd pain GU- denies dysuria, hematuria, dribbling, incontinence MSK- +joint pain, muscle aches, injury Neuro- denies headache, dizziness, syncope, seizure activity       Objective:    BP 108/64   Pulse 67   Temp 98.3 F (36.8 C) (Oral)   Resp 17   Ht 5\' 7"  (1.702 m)   Wt 123 lb 4 oz (55.9 kg)   SpO2 97%   BMI 19.30 kg/m  GEN- NAD, alert and oriented x3, chronically ill appearing  HEENT- PERRL, EOMI, non injected sclera, pink conjunctiva, MMM, oropharynx clear CVS- RRR, no murmur RESP-CTAB MSK- TTP along thoracic and lumbar spine, neg SLR, fair ROM spine, knees, no effusion, no crepitus of knees  Neuro- sensation grossly in tact LE  EXT- No edema Pulses- Radial 2+        Assessment & Plan:    He thinks flu shot was done at [facility]   Problem List Items Addressed This Visit      Unprioritized   Cystic fibrosis ([place])    Fairly complicated cystic fibrosis with multiple infections and hospitalizations.  He has chronic chest wall pain from  his coughing and illness.  He has protein calorie malnutrition which also occurs with his cystic fibrosis.  For his chronic back pain knee pain will obtain repeat  x-rays.  He has never had any imaging of the knee.  I think that he needs to be in a pain management clinic.  States that he has discussed this at [facility] a few times but they never made the referral.      Relevant Orders   Ambulatory referral to Pain Clinic   Protein-calorie malnutrition, severe    Other Visit Diagnoses    Acute pain of left knee    -  Primary   Relevant Orders   DG Knee Complete 4 Views Left   Chronic left-sided low back pain with left-sided sciatica       Relevant Orders   DG Lumbar Spine Complete   DG Thoracic Spine W/Swimmers   Ambulatory referral to Pain Clinic      Note: This dictation was prepared with Dragon dictation along with smaller phrase technology. Any transcriptional errors that result from this process are unintentional.

## 2019-04-08 DIAGNOSIS — E559 Vitamin D deficiency, unspecified: Secondary | ICD-10-CM | POA: Diagnosis not present

## 2019-04-08 DIAGNOSIS — G894 Chronic pain syndrome: Secondary | ICD-10-CM | POA: Diagnosis not present

## 2019-04-08 DIAGNOSIS — M129 Arthropathy, unspecified: Secondary | ICD-10-CM | POA: Diagnosis not present

## 2019-04-08 DIAGNOSIS — M545 Low back pain: Secondary | ICD-10-CM | POA: Diagnosis not present

## 2019-04-08 DIAGNOSIS — Z79891 Long term (current) use of opiate analgesic: Secondary | ICD-10-CM | POA: Diagnosis not present

## 2019-04-08 DIAGNOSIS — Z03818 Encounter for observation for suspected exposure to other biological agents ruled out: Secondary | ICD-10-CM | POA: Diagnosis not present

## 2019-04-08 DIAGNOSIS — Z79899 Other long term (current) drug therapy: Secondary | ICD-10-CM | POA: Diagnosis not present

## 2019-04-08 DIAGNOSIS — M25562 Pain in left knee: Secondary | ICD-10-CM | POA: Diagnosis not present

## 2019-04-08 DIAGNOSIS — R5383 Other fatigue: Secondary | ICD-10-CM | POA: Diagnosis not present

## 2019-04-08 DIAGNOSIS — G8929 Other chronic pain: Secondary | ICD-10-CM | POA: Diagnosis not present

## 2019-04-20 DIAGNOSIS — B965 Pseudomonas (aeruginosa) (mallei) (pseudomallei) as the cause of diseases classified elsewhere: Secondary | ICD-10-CM | POA: Diagnosis not present

## 2019-04-20 DIAGNOSIS — K8689 Other specified diseases of pancreas: Secondary | ICD-10-CM | POA: Diagnosis not present

## 2019-04-20 DIAGNOSIS — Z79899 Other long term (current) drug therapy: Secondary | ICD-10-CM | POA: Diagnosis not present

## 2019-04-20 DIAGNOSIS — J4530 Mild persistent asthma, uncomplicated: Secondary | ICD-10-CM | POA: Diagnosis not present

## 2019-04-20 DIAGNOSIS — M8580 Other specified disorders of bone density and structure, unspecified site: Secondary | ICD-10-CM | POA: Diagnosis not present

## 2019-04-20 DIAGNOSIS — Z23 Encounter for immunization: Secondary | ICD-10-CM | POA: Diagnosis not present

## 2019-04-20 DIAGNOSIS — K219 Gastro-esophageal reflux disease without esophagitis: Secondary | ICD-10-CM | POA: Diagnosis not present

## 2019-04-22 DIAGNOSIS — Z79891 Long term (current) use of opiate analgesic: Secondary | ICD-10-CM | POA: Diagnosis not present

## 2019-05-06 DIAGNOSIS — Z79891 Long term (current) use of opiate analgesic: Secondary | ICD-10-CM | POA: Diagnosis not present

## 2019-06-03 DIAGNOSIS — Z79899 Other long term (current) drug therapy: Secondary | ICD-10-CM | POA: Diagnosis not present

## 2019-06-29 ENCOUNTER — Encounter: Payer: Medicaid Other | Admitting: Family Medicine

## 2019-07-04 DIAGNOSIS — Z79899 Other long term (current) drug therapy: Secondary | ICD-10-CM | POA: Diagnosis not present

## 2019-08-05 DIAGNOSIS — Z79899 Other long term (current) drug therapy: Secondary | ICD-10-CM | POA: Diagnosis not present

## 2019-08-12 DIAGNOSIS — M9903 Segmental and somatic dysfunction of lumbar region: Secondary | ICD-10-CM | POA: Diagnosis not present

## 2019-08-12 DIAGNOSIS — M5414 Radiculopathy, thoracic region: Secondary | ICD-10-CM | POA: Diagnosis not present

## 2019-08-12 DIAGNOSIS — M9902 Segmental and somatic dysfunction of thoracic region: Secondary | ICD-10-CM | POA: Diagnosis not present

## 2019-08-12 DIAGNOSIS — M5417 Radiculopathy, lumbosacral region: Secondary | ICD-10-CM | POA: Diagnosis not present

## 2019-08-12 DIAGNOSIS — M5386 Other specified dorsopathies, lumbar region: Secondary | ICD-10-CM | POA: Diagnosis not present

## 2019-08-12 DIAGNOSIS — M9905 Segmental and somatic dysfunction of pelvic region: Secondary | ICD-10-CM | POA: Diagnosis not present

## 2019-08-22 DIAGNOSIS — M5417 Radiculopathy, lumbosacral region: Secondary | ICD-10-CM | POA: Diagnosis not present

## 2019-08-22 DIAGNOSIS — M5386 Other specified dorsopathies, lumbar region: Secondary | ICD-10-CM | POA: Diagnosis not present

## 2019-08-22 DIAGNOSIS — M9902 Segmental and somatic dysfunction of thoracic region: Secondary | ICD-10-CM | POA: Diagnosis not present

## 2019-08-22 DIAGNOSIS — M9905 Segmental and somatic dysfunction of pelvic region: Secondary | ICD-10-CM | POA: Diagnosis not present

## 2019-08-22 DIAGNOSIS — M9903 Segmental and somatic dysfunction of lumbar region: Secondary | ICD-10-CM | POA: Diagnosis not present

## 2019-08-22 DIAGNOSIS — M5414 Radiculopathy, thoracic region: Secondary | ICD-10-CM | POA: Diagnosis not present

## 2019-09-02 DIAGNOSIS — M5417 Radiculopathy, lumbosacral region: Secondary | ICD-10-CM | POA: Diagnosis not present

## 2019-09-02 DIAGNOSIS — M9903 Segmental and somatic dysfunction of lumbar region: Secondary | ICD-10-CM | POA: Diagnosis not present

## 2019-09-02 DIAGNOSIS — M5414 Radiculopathy, thoracic region: Secondary | ICD-10-CM | POA: Diagnosis not present

## 2019-09-02 DIAGNOSIS — M9902 Segmental and somatic dysfunction of thoracic region: Secondary | ICD-10-CM | POA: Diagnosis not present

## 2019-09-02 DIAGNOSIS — M9905 Segmental and somatic dysfunction of pelvic region: Secondary | ICD-10-CM | POA: Diagnosis not present

## 2019-09-02 DIAGNOSIS — M5386 Other specified dorsopathies, lumbar region: Secondary | ICD-10-CM | POA: Diagnosis not present

## 2019-09-12 DIAGNOSIS — Z79899 Other long term (current) drug therapy: Secondary | ICD-10-CM | POA: Diagnosis not present

## 2019-09-30 DIAGNOSIS — M9905 Segmental and somatic dysfunction of pelvic region: Secondary | ICD-10-CM | POA: Diagnosis not present

## 2019-09-30 DIAGNOSIS — M5417 Radiculopathy, lumbosacral region: Secondary | ICD-10-CM | POA: Diagnosis not present

## 2019-09-30 DIAGNOSIS — M5386 Other specified dorsopathies, lumbar region: Secondary | ICD-10-CM | POA: Diagnosis not present

## 2019-09-30 DIAGNOSIS — M9902 Segmental and somatic dysfunction of thoracic region: Secondary | ICD-10-CM | POA: Diagnosis not present

## 2019-09-30 DIAGNOSIS — M9903 Segmental and somatic dysfunction of lumbar region: Secondary | ICD-10-CM | POA: Diagnosis not present

## 2019-09-30 DIAGNOSIS — M5414 Radiculopathy, thoracic region: Secondary | ICD-10-CM | POA: Diagnosis not present

## 2019-10-13 DIAGNOSIS — M9905 Segmental and somatic dysfunction of pelvic region: Secondary | ICD-10-CM | POA: Diagnosis not present

## 2019-10-13 DIAGNOSIS — Z79899 Other long term (current) drug therapy: Secondary | ICD-10-CM | POA: Diagnosis not present

## 2019-10-13 DIAGNOSIS — M5414 Radiculopathy, thoracic region: Secondary | ICD-10-CM | POA: Diagnosis not present

## 2019-10-13 DIAGNOSIS — M5386 Other specified dorsopathies, lumbar region: Secondary | ICD-10-CM | POA: Diagnosis not present

## 2019-10-13 DIAGNOSIS — M9902 Segmental and somatic dysfunction of thoracic region: Secondary | ICD-10-CM | POA: Diagnosis not present

## 2019-10-13 DIAGNOSIS — M9903 Segmental and somatic dysfunction of lumbar region: Secondary | ICD-10-CM | POA: Diagnosis not present

## 2019-10-13 DIAGNOSIS — M5417 Radiculopathy, lumbosacral region: Secondary | ICD-10-CM | POA: Diagnosis not present

## 2019-11-07 DIAGNOSIS — Z7983 Long term (current) use of bisphosphonates: Secondary | ICD-10-CM | POA: Diagnosis not present

## 2019-11-07 DIAGNOSIS — K769 Liver disease, unspecified: Secondary | ICD-10-CM | POA: Diagnosis not present

## 2019-11-07 DIAGNOSIS — Z882 Allergy status to sulfonamides status: Secondary | ICD-10-CM | POA: Diagnosis not present

## 2019-11-07 DIAGNOSIS — K8681 Exocrine pancreatic insufficiency: Secondary | ICD-10-CM | POA: Diagnosis not present

## 2019-11-07 DIAGNOSIS — F329 Major depressive disorder, single episode, unspecified: Secondary | ICD-10-CM | POA: Diagnosis not present

## 2019-11-07 DIAGNOSIS — K219 Gastro-esophageal reflux disease without esophagitis: Secondary | ICD-10-CM | POA: Diagnosis not present

## 2019-11-07 DIAGNOSIS — M8580 Other specified disorders of bone density and structure, unspecified site: Secondary | ICD-10-CM | POA: Diagnosis not present

## 2019-11-07 DIAGNOSIS — Z79899 Other long term (current) drug therapy: Secondary | ICD-10-CM | POA: Diagnosis not present

## 2019-11-07 DIAGNOSIS — J329 Chronic sinusitis, unspecified: Secondary | ICD-10-CM | POA: Diagnosis not present

## 2019-11-07 DIAGNOSIS — K8689 Other specified diseases of pancreas: Secondary | ICD-10-CM | POA: Diagnosis not present

## 2019-11-07 DIAGNOSIS — Z880 Allergy status to penicillin: Secondary | ICD-10-CM | POA: Diagnosis not present

## 2019-11-07 DIAGNOSIS — J4530 Mild persistent asthma, uncomplicated: Secondary | ICD-10-CM | POA: Diagnosis not present

## 2019-11-07 DIAGNOSIS — F419 Anxiety disorder, unspecified: Secondary | ICD-10-CM | POA: Diagnosis not present

## 2019-11-09 DIAGNOSIS — M5417 Radiculopathy, lumbosacral region: Secondary | ICD-10-CM | POA: Diagnosis not present

## 2019-11-09 DIAGNOSIS — M5414 Radiculopathy, thoracic region: Secondary | ICD-10-CM | POA: Diagnosis not present

## 2019-11-09 DIAGNOSIS — M9903 Segmental and somatic dysfunction of lumbar region: Secondary | ICD-10-CM | POA: Diagnosis not present

## 2019-11-09 DIAGNOSIS — M5386 Other specified dorsopathies, lumbar region: Secondary | ICD-10-CM | POA: Diagnosis not present

## 2019-11-09 DIAGNOSIS — M9905 Segmental and somatic dysfunction of pelvic region: Secondary | ICD-10-CM | POA: Diagnosis not present

## 2019-11-09 DIAGNOSIS — Z79899 Other long term (current) drug therapy: Secondary | ICD-10-CM | POA: Diagnosis not present

## 2019-11-09 DIAGNOSIS — M9902 Segmental and somatic dysfunction of thoracic region: Secondary | ICD-10-CM | POA: Diagnosis not present

## 2019-12-09 DIAGNOSIS — M9902 Segmental and somatic dysfunction of thoracic region: Secondary | ICD-10-CM | POA: Diagnosis not present

## 2019-12-09 DIAGNOSIS — M5414 Radiculopathy, thoracic region: Secondary | ICD-10-CM | POA: Diagnosis not present

## 2019-12-09 DIAGNOSIS — M9903 Segmental and somatic dysfunction of lumbar region: Secondary | ICD-10-CM | POA: Diagnosis not present

## 2019-12-09 DIAGNOSIS — M5417 Radiculopathy, lumbosacral region: Secondary | ICD-10-CM | POA: Diagnosis not present

## 2019-12-09 DIAGNOSIS — M9905 Segmental and somatic dysfunction of pelvic region: Secondary | ICD-10-CM | POA: Diagnosis not present

## 2019-12-09 DIAGNOSIS — M5386 Other specified dorsopathies, lumbar region: Secondary | ICD-10-CM | POA: Diagnosis not present

## 2020-09-17 DIAGNOSIS — M25562 Pain in left knee: Secondary | ICD-10-CM | POA: Diagnosis not present

## 2020-09-17 DIAGNOSIS — M8580 Other specified disorders of bone density and structure, unspecified site: Secondary | ICD-10-CM | POA: Diagnosis not present

## 2020-09-17 DIAGNOSIS — R918 Other nonspecific abnormal finding of lung field: Secondary | ICD-10-CM | POA: Diagnosis not present

## 2020-09-17 DIAGNOSIS — M25561 Pain in right knee: Secondary | ICD-10-CM | POA: Diagnosis not present

## 2020-09-17 DIAGNOSIS — Q446 Cystic disease of liver: Secondary | ICD-10-CM | POA: Diagnosis not present

## 2020-09-17 DIAGNOSIS — Z131 Encounter for screening for diabetes mellitus: Secondary | ICD-10-CM | POA: Diagnosis not present

## 2020-09-17 DIAGNOSIS — K8689 Other specified diseases of pancreas: Secondary | ICD-10-CM | POA: Diagnosis not present

## 2020-09-17 DIAGNOSIS — M79641 Pain in right hand: Secondary | ICD-10-CM | POA: Diagnosis not present

## 2020-09-17 DIAGNOSIS — M79642 Pain in left hand: Secondary | ICD-10-CM | POA: Diagnosis not present

## 2020-09-17 DIAGNOSIS — M25541 Pain in joints of right hand: Secondary | ICD-10-CM | POA: Diagnosis not present

## 2020-09-17 DIAGNOSIS — M25542 Pain in joints of left hand: Secondary | ICD-10-CM | POA: Diagnosis not present

## 2020-09-17 DIAGNOSIS — J479 Bronchiectasis, uncomplicated: Secondary | ICD-10-CM | POA: Diagnosis not present

## 2020-09-17 DIAGNOSIS — Z792 Long term (current) use of antibiotics: Secondary | ICD-10-CM | POA: Diagnosis not present

## 2020-09-17 DIAGNOSIS — Z882 Allergy status to sulfonamides status: Secondary | ICD-10-CM | POA: Diagnosis not present

## 2020-09-17 DIAGNOSIS — Z7951 Long term (current) use of inhaled steroids: Secondary | ICD-10-CM | POA: Diagnosis not present

## 2020-09-17 DIAGNOSIS — M549 Dorsalgia, unspecified: Secondary | ICD-10-CM | POA: Diagnosis not present

## 2020-09-17 DIAGNOSIS — G8929 Other chronic pain: Secondary | ICD-10-CM | POA: Diagnosis not present

## 2020-09-17 DIAGNOSIS — Z79899 Other long term (current) drug therapy: Secondary | ICD-10-CM | POA: Diagnosis not present

## 2020-09-17 DIAGNOSIS — Z880 Allergy status to penicillin: Secondary | ICD-10-CM | POA: Diagnosis not present

## 2020-09-17 DIAGNOSIS — F32A Depression, unspecified: Secondary | ICD-10-CM | POA: Diagnosis not present

## 2020-09-17 DIAGNOSIS — J329 Chronic sinusitis, unspecified: Secondary | ICD-10-CM | POA: Diagnosis not present

## 2021-01-25 ENCOUNTER — Other Ambulatory Visit: Payer: Self-pay

## 2021-01-25 ENCOUNTER — Encounter (HOSPITAL_COMMUNITY): Payer: Self-pay | Admitting: Emergency Medicine

## 2021-01-25 ENCOUNTER — Emergency Department (HOSPITAL_COMMUNITY)
Admission: EM | Admit: 2021-01-25 | Discharge: 2021-01-26 | Disposition: A | Discharge status: Home / Self Care | Payer: Medicaid Other | Attending: Emergency Medicine | Admitting: Emergency Medicine

## 2021-01-25 DIAGNOSIS — K047 Periapical abscess without sinus: Secondary | ICD-10-CM

## 2021-01-25 DIAGNOSIS — K029 Dental caries, unspecified: Secondary | ICD-10-CM | POA: Diagnosis not present

## 2021-01-25 DIAGNOSIS — Z87891 Personal history of nicotine dependence: Secondary | ICD-10-CM | POA: Diagnosis not present

## 2021-01-25 DIAGNOSIS — K0889 Other specified disorders of teeth and supporting structures: Secondary | ICD-10-CM | POA: Diagnosis present

## 2021-01-25 NOTE — ED Triage Notes (Signed)
Pt reports abscess to his left upper canine tooth. Pt has pain in his tooth and his jaw. Pt reports the pain is so bad that he cannot eat or drink.

## 2021-01-26 DIAGNOSIS — K047 Periapical abscess without sinus: Secondary | ICD-10-CM | POA: Diagnosis not present

## 2021-01-26 DIAGNOSIS — K029 Dental caries, unspecified: Secondary | ICD-10-CM | POA: Diagnosis not present

## 2021-01-26 MED ORDER — CLINDAMYCIN HCL 300 MG PO CAPS
300.0000 mg | ORAL_CAPSULE | Freq: Once | ORAL | Status: AC
  Administered 2021-01-26: 300 mg via ORAL
  Filled 2021-01-26: qty 1

## 2021-01-26 MED ORDER — CLINDAMYCIN HCL 300 MG PO CAPS: 300.0000 mg | ORAL_CAPSULE | Freq: Three times a day (TID) | ORAL | 0 refills | Status: AC

## 2021-01-26 MED ORDER — HYDROCODONE-ACETAMINOPHEN 5-325 MG PO TABS
1.0000 | ORAL_TABLET | Freq: Once | ORAL | Status: AC
Start: 2021-01-26 — End: 2021-01-26
  Administered 2021-01-26: 1 via ORAL
  Filled 2021-01-26: qty 1

## 2021-01-26 NOTE — ED Provider Notes (Signed)
[facility]-EMERGENCY DEPT Provider Note   CSN: [id number] Arrival date & time: 01/25/21  2254     History Chief Complaint  Patient presents with   Dental Pain    [name] [name] is a 30 y.o. male.  The history is provided by the patient.  Dental Pain Location:  Upper Severity:  Moderate Onset quality:  Gradual Duration:  2 days Timing:  Constant Progression:  Worsening Chronicity:  New Relieved by:  Nothing Worsened by:  Jaw movement and pressure Associated symptoms: no fever       Past Medical History:  Diagnosis Date   Acid reflux    Cystic fibrosis     Patient Active Problem List   Diagnosis Date Noted   Protein-calorie malnutrition, severe 04/05/2018   Community acquired pneumonia of right middle lobe of lung 04/02/2018   Acute exacerbation of bronchiectasis (HCC) 01/16/2015   Protein-calorie malnutrition (HCC) 10/30/2014   Respiratory illness 04/10/2014   Depression with anxiety 01/02/2014   Insomnia 05/25/2013   Rib pain on right side 05/25/2013   Tracheobronchitis 05/16/2011   Cystic fibrosis (HCC) 05/13/2011    Past Surgical History:  Procedure Laterality Date   picc line placements     right ear surgery     removal of excess skin       Family History  Problem Relation Age of Onset   Brain cancer Paternal Grandfather     Social History   Tobacco Use   Smoking status: Former    Types: Cigarettes    Quit date: 04/12/2011    Years since quitting: 9.8   Smokeless tobacco: Never  Substance Use Topics   Alcohol use: Yes    Alcohol/week: 0.0 standard drinks   Drug use: Yes    Types: Marijuana    Home Medications Prior to Admission medications   Medication Sig Start Date End Date Taking? Authorizing Provider  clindamycin (CLEOCIN) 300 MG capsule Take 1 capsule (300 mg total) by mouth 3 (three) times daily. 01/26/21  Yes Zadie Rhine, MD  albuterol (PROVENTIL HFA;VENTOLIN HFA) 108 (90 BASE) MCG/ACT inhaler  Inhale 2 puffs into the lungs every 4 (four) hours as needed. Wheezing 03/16/15   Salley Scarlet, MD  albuterol (PROVENTIL) (2.5 MG/3ML) 0.083% nebulizer solution Take 2.5 mg by nebulization every 6 (six) hours as needed for wheezing or shortness of breath.    [provider]  azithromycin (ZITHROMAX) 500 MG tablet Take 500 mg by mouth every Monday, Wednesday, and Friday. 02/26/18   [provider]  budesonide (PULMICORT) 0.5 MG/2ML nebulizer solution Take 0.5 mg by nebulization 2 (two) times daily.    [provider]  budesonide-formoterol (SYMBICORT) 160-4.5 MCG/ACT inhaler Inhale 2 puffs into the lungs 2 (two) times daily. 01/14/16 04/02/26  [provider]  calcium gluconate 500 MG tablet Take 1 tablet by mouth 2 (two) times daily.     [provider]  cholecalciferol (VITAMIN D) 1000 units tablet Take 3,000 Units by mouth daily. 01/22/18   [provider]  levocetirizine (XYZAL) 5 MG tablet TAKE 1 TABLET BY MOUTH EVERY EVENING Patient taking differently: Take 5 mg by mouth every evening.  07/21/16   Salley Scarlet, MD  Multiple Vitamins-Minerals (MULTIVITAMINS THER. W/MINERALS) TABS Take 1 tablet by mouth daily.      [provider]  pantoprazole (PROTONIX) 40 MG tablet TAKE ONE TABLET BY MOUTH TWICE DAILY Patient taking differently: Take 40 mg by mouth 2 (two) times daily.  05/27/17  [place], Velna Hatchet, MD  ursodiol (ACTIGALL) 250 MG tablet Take 1 tablet (250 mg total) by mouth 2 (two) times daily. 05/19/16   Dorena Bodo, PA-C  vitamin C (ASCORBIC ACID) 500 MG tablet Take 500 mg by mouth daily.      [provider]    Allergies    Amoxapine and related, Ondansetron, Amoxicillin, Ondansetron hcl, Penicillins, and Sulfa antibiotics  Review of Systems   Review of Systems  Constitutional:  Negative for fever.  Eyes:  Negative for visual disturbance.   Physical Exam Updated Vital Signs BP 117/80 (BP Location: Left Arm)    Pulse 63   Temp 98.4 F (36.9 C) (Oral)   Resp 15   Ht 1.753 m (5\' 9" )   Wt 59 kg   SpO2 100%   BMI 19.20 kg/m   Physical Exam CONSTITUTIONAL: Well developed/well nourished HEAD AND FACE: Normocephalic/atraumatic EYES: EOMI/PERRL, no proptosis ENMT: Mucous membranes moist.  Poor dentition.  No trismus.  No focal abscess noted.  Mild left facial swelling noted. NECK: supple no meningeal signs ABDOMEN: soft, nontender, no rebound or guarding NEURO: Pt is awake/alert, moves all extremitiesx4 EXTREMITIES:full ROM SKIN: warm, color normal  ED Results / Procedures / Treatments   Labs (all labs ordered are listed, but only abnormal results are displayed) Labs Reviewed - No data to display  EKG None  Radiology No results found.  Procedures Procedures   Medications Ordered in ED Medications  clindamycin (CLEOCIN) capsule 300 mg (300 mg Oral Given 01/26/21 0146)  HYDROcodone-acetaminophen (NORCO/VICODIN) 5-325 MG per tablet 1 tablet (1 tablet Oral Given 01/26/21 0146)    ED Course  I have reviewed the triage vital signs and the nursing notes.    MDM Rules/Calculators/A&P                            Final Clinical Impression(s) / ED Diagnoses Final diagnoses:  Dental caries  Dental infection    Rx / DC Orders ED Discharge Orders          Ordered    clindamycin (CLEOCIN) 300 MG capsule  3 times daily        01/26/21 0119             01/28/21, MD 01/26/21 0234

## 2021-01-28 ENCOUNTER — Telehealth: Payer: Self-pay

## 2021-01-28 NOTE — Telephone Encounter (Addendum)
Transition Care Management Follow-up Telephone Call Date of discharge and from where: 01/26/2021-[place]  How have you been since you were released from the hospital? Patient stated he is doing ok.  Any questions or concerns? No  Items Reviewed: Did the pt receive and understand the discharge instructions provided? Yes  Medications obtained and verified? Yes  Other? No  Any new allergies since your discharge? No  Dietary orders reviewed? N/A Do you have support at home? Yes   Home Care and Equipment/Supplies: Were home health services ordered? not applicable If so, what is the name of the agency? N/A  Has the agency set up a time to come to the patient's home? not applicable Were any new equipment or medical supplies ordered?  No What is the name of the medical supply agency? N/A Were you able to get the supplies/equipment? not applicable Do you have any questions related to the use of the equipment or supplies? No  Functional Questionnaire: (I = Independent and D = Dependent) ADLs: I  Bathing/Dressing- I  Meal Prep- I  Eating- I  Maintaining continence- I  Transferring/Ambulation- I  Managing Meds- I  Follow up appointments reviewed:  PCP Hospital f/u appt confirmed? No   Specialist Hospital f/u appt confirmed? No   Are transportation arrangements needed? No  If their condition worsens, is the pt aware to call PCP or go to the Emergency Dept.? Yes Was the patient provided with contact information for the PCP's office or ED? Yes Was to pt encouraged to call back with questions or concerns? Yes

## 2021-08-27 IMAGING — CR DG LUMBAR SPINE COMPLETE 4+V
5 series · 5 of 5 positions shown · non-contrast
Comparison: None.

CLINICAL DATA: Low back pain

EXAM:
LUMBAR SPINE - COMPLETE 4+ VIEW

[t lumbar spine ap]
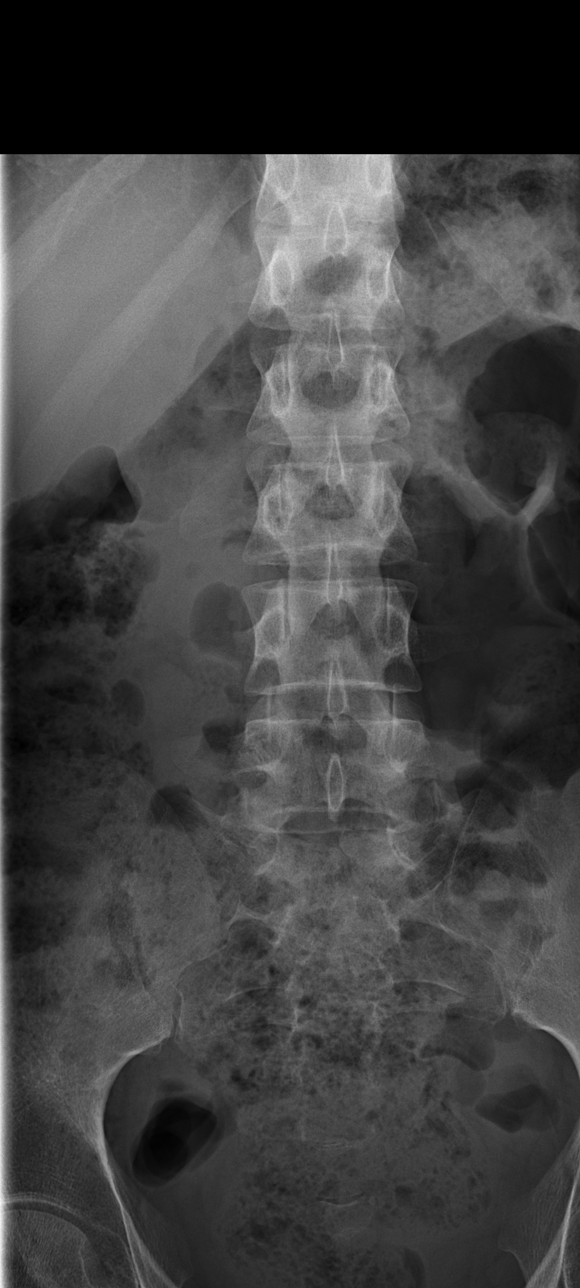

[t lumbar spine obl (1 of 2)]
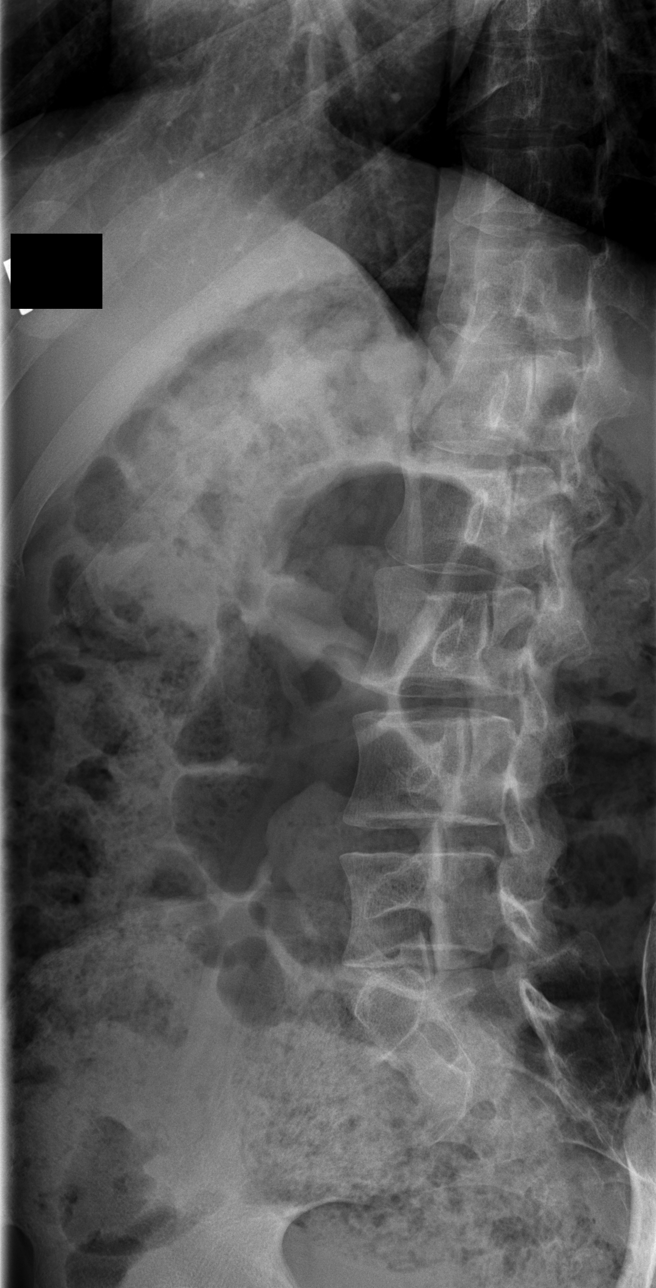

[t lumbar spine obl (2 of 2)]
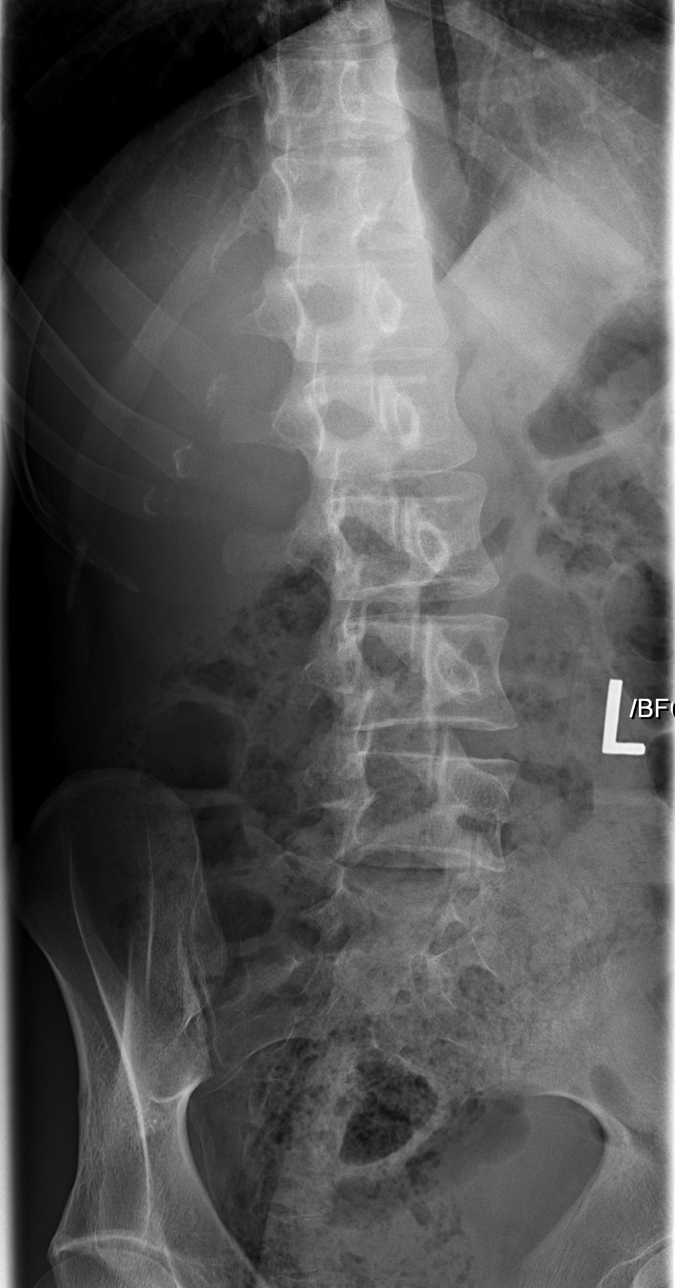

[t lumbar spine lat]
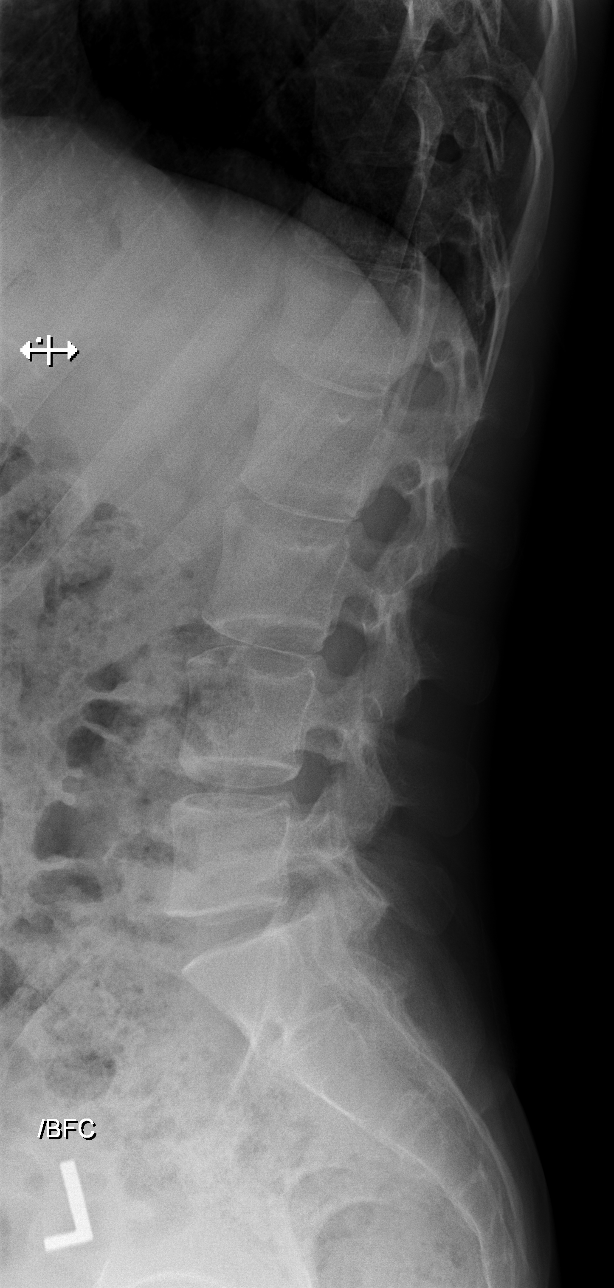

[t lumbar l-5 s-1 spot]
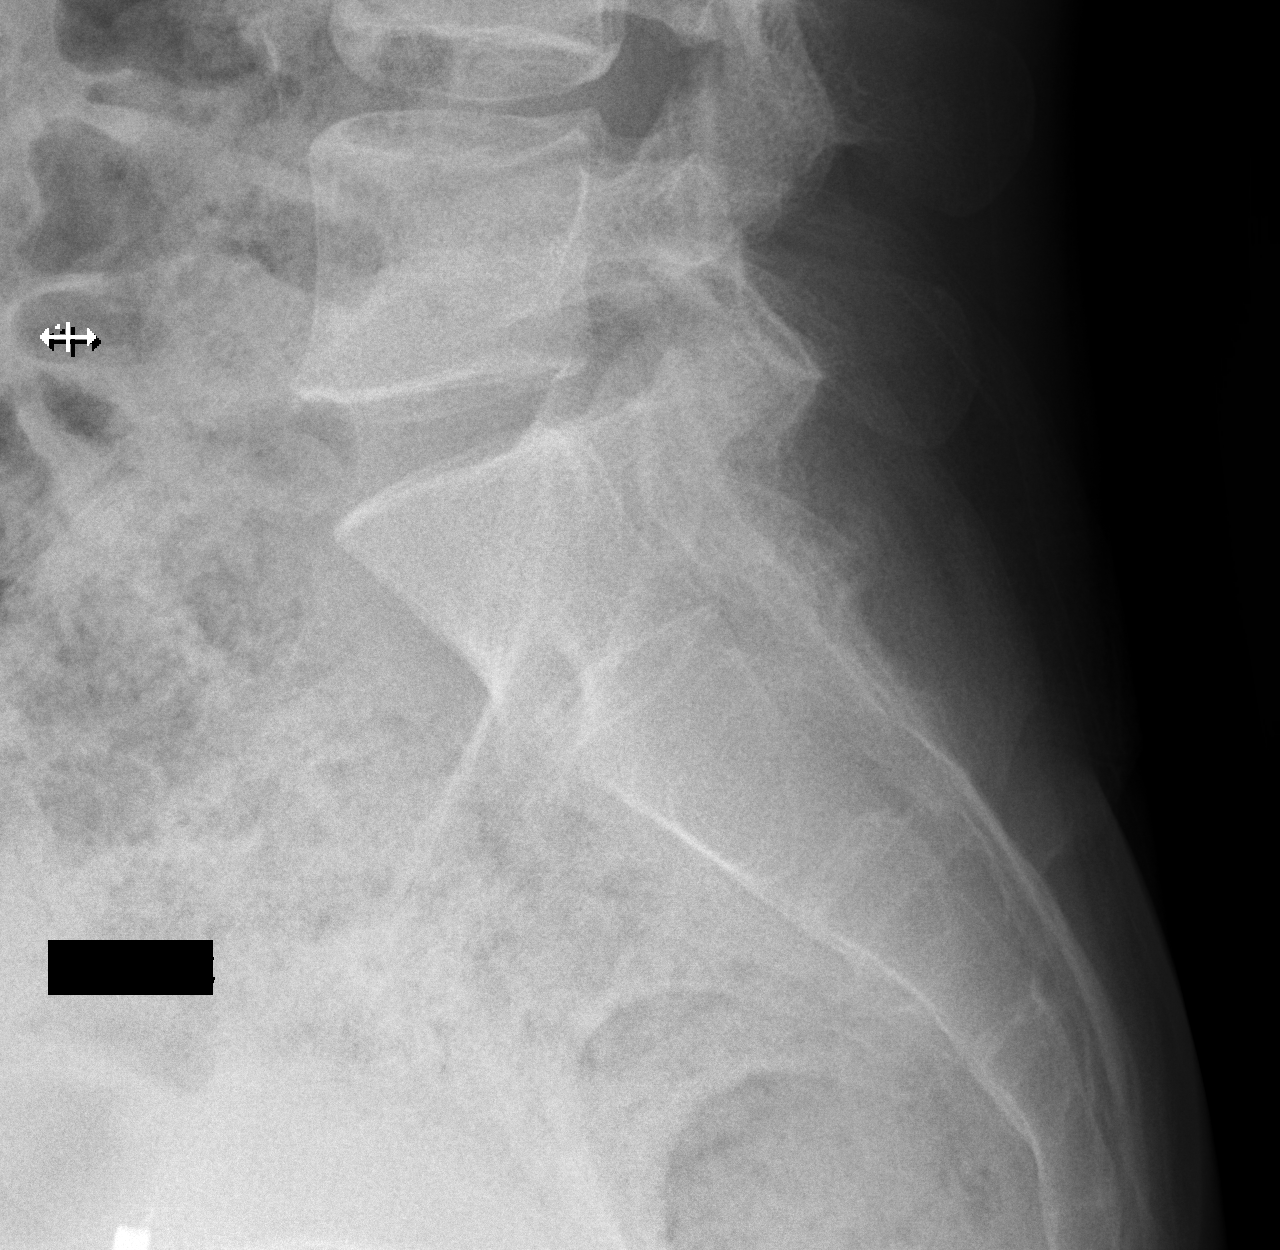

[5 of 5 positions shown; findings below may reference images not displayed]

FINDINGS: There is no evidence of lumbar spine fracture. Alignment is normal.
Intervertebral disc spaces are maintained.
IMPRESSION: Negative.
